# Patient Record
Sex: Female | Born: 1940 | Race: White | Hispanic: No | Smoking: Never smoker
Health system: Southern US, Community
[De-identification: ages and names within clinical notes are randomized; demographics above are authoritative.]

## PROBLEM LIST (undated history)

## (undated) DIAGNOSIS — M1990 Unspecified osteoarthritis, unspecified site: Secondary | ICD-10-CM

## (undated) DIAGNOSIS — K5790 Diverticulosis of intestine, part unspecified, without perforation or abscess without bleeding: Secondary | ICD-10-CM

## (undated) DIAGNOSIS — I10 Essential (primary) hypertension: Secondary | ICD-10-CM

## (undated) DIAGNOSIS — E785 Hyperlipidemia, unspecified: Secondary | ICD-10-CM

## (undated) DIAGNOSIS — Z87442 Personal history of urinary calculi: Secondary | ICD-10-CM

## (undated) HISTORY — DX: Diverticulosis of intestine, part unspecified, without perforation or abscess without bleeding: K57.90

## (undated) HISTORY — DX: Unspecified osteoarthritis, unspecified site: M19.90

## (undated) HISTORY — PX: TONSILLECTOMY: SUR1361

## (undated) HISTORY — PX: BREAST CYST ASPIRATION: SHX578

---

## 2010-01-04 ENCOUNTER — Ambulatory Visit: Payer: Self-pay | Admitting: Internal Medicine

## 2011-03-03 DIAGNOSIS — E782 Mixed hyperlipidemia: Secondary | ICD-10-CM | POA: Insufficient documentation

## 2011-03-03 DIAGNOSIS — Z8739 Personal history of other diseases of the musculoskeletal system and connective tissue: Secondary | ICD-10-CM | POA: Insufficient documentation

## 2011-03-03 DIAGNOSIS — I10 Essential (primary) hypertension: Secondary | ICD-10-CM | POA: Insufficient documentation

## 2011-11-20 ENCOUNTER — Ambulatory Visit: Payer: Self-pay | Admitting: Family Medicine

## 2012-09-11 ENCOUNTER — Ambulatory Visit: Payer: Self-pay | Admitting: Family Medicine

## 2012-09-11 ENCOUNTER — Emergency Department: Payer: Self-pay | Admitting: Emergency Medicine

## 2012-09-11 LAB — URINALYSIS, COMPLETE
Bacteria: NONE SEEN
Bilirubin,UR: NEGATIVE
Leukocyte Esterase: NEGATIVE
Nitrite: NEGATIVE
RBC,UR: 1028 /HPF (ref 0–5)
Squamous Epithelial: NONE SEEN

## 2012-09-11 LAB — CBC WITH DIFFERENTIAL/PLATELET
Basophil %: 0.3 %
Eosinophil %: 0.2 %
HGB: 13.1 g/dL (ref 12.0–16.0)
MCH: 31.4 pg (ref 26.0–34.0)
Neutrophil #: 2.8 10*3/uL (ref 1.4–6.5)
Neutrophil %: 63.8 %
RBC: 4.16 10*6/uL (ref 3.80–5.20)
RDW: 13.9 % (ref 11.5–14.5)
WBC: 4.4 10*3/uL (ref 3.6–11.0)

## 2012-09-11 LAB — COMPREHENSIVE METABOLIC PANEL
Albumin: 4 g/dL (ref 3.4–5.0)
Alkaline Phosphatase: 48 U/L — ABNORMAL LOW (ref 50–136)
Anion Gap: 7 (ref 7–16)
BUN: 13 mg/dL (ref 7–18)
Bilirubin,Total: 0.5 mg/dL (ref 0.2–1.0)
Calcium, Total: 9.6 mg/dL (ref 8.5–10.1)
Chloride: 103 mmol/L (ref 98–107)
EGFR (African American): >60
EGFR (Non-African Amer.): >60
SGOT(AST): 32 U/L (ref 15–37)
SGPT (ALT): 30 U/L (ref 12–78)
Sodium: 138 mmol/L (ref 136–145)
Total Protein: 8.4 g/dL — ABNORMAL HIGH (ref 6.4–8.2)

## 2012-09-11 LAB — LIPASE, BLOOD: Lipase: 170 U/L (ref 73–393)

## 2012-11-23 ENCOUNTER — Ambulatory Visit: Payer: Self-pay | Admitting: Family Medicine

## 2014-01-18 ENCOUNTER — Ambulatory Visit: Payer: Self-pay | Admitting: Family Medicine

## 2014-09-16 DIAGNOSIS — M1711 Unilateral primary osteoarthritis, right knee: Secondary | ICD-10-CM | POA: Insufficient documentation

## 2015-06-04 ENCOUNTER — Other Ambulatory Visit: Payer: Self-pay | Admitting: Family Medicine

## 2015-06-04 DIAGNOSIS — Z1231 Encounter for screening mammogram for malignant neoplasm of breast: Secondary | ICD-10-CM

## 2015-06-07 ENCOUNTER — Ambulatory Visit
Admission: RE | Admit: 2015-06-07 | Discharge: 2015-06-07 | Disposition: A | Discharge status: Home / Self Care | Payer: Medicare Other | Source: Ambulatory Visit | Attending: Family Medicine | Admitting: Family Medicine

## 2015-06-07 DIAGNOSIS — Z1231 Encounter for screening mammogram for malignant neoplasm of breast: Secondary | ICD-10-CM | POA: Insufficient documentation

## 2015-07-04 ENCOUNTER — Encounter: Payer: Self-pay | Admitting: *Deleted

## 2015-07-05 ENCOUNTER — Encounter
Admission: RE | Disposition: A | Discharge status: Home / Self Care | Payer: Self-pay | Source: Ambulatory Visit | Attending: Gastroenterology

## 2015-07-05 ENCOUNTER — Ambulatory Visit
Admission: RE | Admit: 2015-07-05 | Discharge: 2015-07-05 | Disposition: A | Discharge status: Home / Self Care | Payer: Medicare Other | Source: Ambulatory Visit | Attending: Gastroenterology | Admitting: Gastroenterology

## 2015-07-05 ENCOUNTER — Encounter: Payer: Self-pay | Admitting: Anesthesiology

## 2015-07-05 ENCOUNTER — Ambulatory Visit: Payer: Medicare Other | Admitting: Anesthesiology

## 2015-07-05 DIAGNOSIS — E785 Hyperlipidemia, unspecified: Secondary | ICD-10-CM | POA: Insufficient documentation

## 2015-07-05 DIAGNOSIS — K625 Hemorrhage of anus and rectum: Secondary | ICD-10-CM | POA: Insufficient documentation

## 2015-07-05 DIAGNOSIS — I10 Essential (primary) hypertension: Secondary | ICD-10-CM | POA: Insufficient documentation

## 2015-07-05 DIAGNOSIS — Z87891 Personal history of nicotine dependence: Secondary | ICD-10-CM | POA: Insufficient documentation

## 2015-07-05 DIAGNOSIS — K5730 Diverticulosis of large intestine without perforation or abscess without bleeding: Secondary | ICD-10-CM | POA: Insufficient documentation

## 2015-07-05 DIAGNOSIS — Z79899 Other long term (current) drug therapy: Secondary | ICD-10-CM | POA: Diagnosis not present

## 2015-07-05 DIAGNOSIS — Z7982 Long term (current) use of aspirin: Secondary | ICD-10-CM | POA: Diagnosis not present

## 2015-07-05 HISTORY — DX: Hyperlipidemia, unspecified: E78.5

## 2015-07-05 HISTORY — DX: Essential (primary) hypertension: I10

## 2015-07-05 HISTORY — PX: COLONOSCOPY WITH PROPOFOL: SHX5780

## 2015-07-05 SURGERY — COLONOSCOPY WITH PROPOFOL
Anesthesia: General

## 2015-07-05 MED ORDER — FENTANYL CITRATE (PF) 100 MCG/2ML IJ SOLN
INTRAMUSCULAR | Status: DC | PRN
  Administered 2015-07-05: 50 ug via INTRAVENOUS

## 2015-07-05 MED ORDER — PROPOFOL 500 MG/50ML IV EMUL
INTRAVENOUS | Status: DC | PRN
  Administered 2015-07-05: 120 ug/kg/min via INTRAVENOUS

## 2015-07-05 MED ORDER — SODIUM CHLORIDE 0.9 % IV SOLN
INTRAVENOUS | Status: DC
  Administered 2015-07-05: 09:00:00 via INTRAVENOUS

## 2015-07-05 MED ORDER — MIDAZOLAM HCL 2 MG/2ML IJ SOLN
INTRAMUSCULAR | Status: DC | PRN
  Administered 2015-07-05: 1 mg via INTRAVENOUS

## 2015-07-05 NOTE — Anesthesia Preprocedure Evaluation (Signed)
Anesthesia Evaluation  Patient identified by MRN, date of birth, ID band Patient awake    Reviewed: Allergy & Precautions, NPO status , Patient's Chart, lab work & pertinent test results, reviewed documented beta blocker date and time   Airway Mallampati: II       Dental no notable dental hx. (+) Teeth Intact   Pulmonary neg pulmonary ROS,    breath sounds clear to auscultation       Cardiovascular Exercise Tolerance: Good hypertension, Pt. on home beta blockers  Rhythm:Regular Rate:Normal     Neuro/Psych    GI/Hepatic negative GI ROS, Neg liver ROS,   Endo/Other  negative endocrine ROS  Renal/GU negative Renal ROS     Musculoskeletal   Abdominal Normal abdominal exam  (+)   Peds  Hematology negative hematology ROS (+)   Anesthesia Other Findings   Reproductive/Obstetrics                             Anesthesia Physical Anesthesia Plan  ASA: II  Anesthesia Plan: General   Post-op Pain Management:    Induction: Intravenous  Airway Management Planned: Nasal Cannula  Additional Equipment:   Intra-op Plan:   Post-operative Plan:   Informed Consent: I have reviewed the patients History and Physical, chart, labs and discussed the procedure including the risks, benefits and alternatives for the proposed anesthesia with the patient or authorized representative who has indicated his/her understanding and acceptance.     Plan Discussed with: CRNA  Anesthesia Plan Comments:         Anesthesia Quick Evaluation

## 2015-07-05 NOTE — Transfer of Care (Signed)
Immediate Anesthesia Transfer of Care Note  Patient: Cassandra PulsLinda N Thai  Procedure(s) Performed: Procedure(s): COLONOSCOPY WITH PROPOFOL (N/A)  Patient Location: PACU  Anesthesia Type:General  Level of Consciousness: awake and sedated  Airway & Oxygen Therapy: Patient Spontanous Breathing and Patient connected to nasal cannula oxygen  Post-op Assessment: Report given to RN and Post -op Vital signs reviewed and stable  Post vital signs: Reviewed and stable  Last Vitals:  Filed Vitals:   07/05/15 0827  BP: 155/54  Pulse: 63  Temp: 36.5 C  Resp: 15    Complications: No apparent anesthesia complications

## 2015-07-05 NOTE — Op Note (Signed)
[facility] Gastroenterology Patient Name: Cassandra Ball Procedure Date: 07/05/2015 9:15 AM MRN: [id number] Account #: [id number] Date of Birth: Apr 01, 1941 Admit Type: Outpatient Age: 74 Room: [facility] ENDO ROOM 3 Gender: Female Note Status: Finalized Procedure:         Colonoscopy Indications:       Rectal bleeding Providers:         Ezzard Standing. Bluford Kaufmann, MD Referring MD:      Clelia Croft. Ingledue (Referring MD) Medicines:         Monitored Anesthesia Care Complications:     No immediate complications. Procedure:         Pre-Anesthesia Assessment:                    - Prior to the procedure, a History and Physical was                     performed, and patient medications, allergies and                     sensitivities were reviewed. The patient's tolerance of                     previous anesthesia was reviewed.                    - The risks and benefits of the procedure and the sedation                     options and risks were discussed with the patient. All                     questions were answered and informed consent was obtained.                    - After reviewing the risks and benefits, the patient was                     deemed in satisfactory condition to undergo the procedure.                    After obtaining informed consent, the colonoscope was                     passed under direct vision. Throughout the procedure, the                     patient's blood pressure, pulse, and oxygen saturations                     were monitored continuously. The Olympus PCF-H180AL                     colonoscope ( S#: [id number] ) was introduced through the                     anus and advanced to the the cecum, identified by                     appendiceal orifice and ileocecal valve. The colonoscopy                     was performed without difficulty. The patient tolerated  the procedure well. The quality of the bowel preparation                     was  good. Findings:      Multiple small and large-mouthed diverticula were found in the sigmoid       colon.      The exam was otherwise without abnormality. Impression:        - Diverticulosis in the sigmoid colon.                    - The examination was otherwise normal.                    - No specimens collected. Recommendation:    - Discharge patient to home.                    - The findings and recommendations were discussed with the                     patient. Procedure Code(s): --- Professional ---                    [id number], Colonoscopy, flexible; diagnostic, including                     collection of specimen(s) by brushing or washing, when                     performed (separate procedure) Diagnosis Code(s): --- Professional ---                    K62.5, Hemorrhage of anus and rectum                    K57.30, Diverticulosis of large intestine without                     perforation or abscess without bleeding CPT copyright 2014 American Medical Association. All rights reserved. The codes documented in this report are preliminary and upon coder review may  be revised to meet current compliance requirements. Wallace CullensPaul Y Emmogene Simson, MD 07/05/2015 9:38:00 AM This report has been signed electronically. Number of Addenda: 0 Note Initiated On: 07/05/2015 9:15 AM Scope Withdrawal Time: 0 hours 7 minutes 11 seconds  Total Procedure Duration: 0 hours 13 minutes 27 seconds       [facility]

## 2015-07-05 NOTE — H&P (Signed)
  Date of Initial H&P: 06/14/2015  History reviewed, patient examined, no change in status, stable for surgery. 

## 2015-07-05 NOTE — Anesthesia Procedure Notes (Signed)
Performed by: COOK-MARTIN, Rashena Dowling Pre-anesthesia Checklist: Patient identified, Emergency Drugs available, Suction available, Patient being monitored and Timeout performed Patient Re-evaluated:Patient Re-evaluated prior to inductionOxygen Delivery Method: Nasal cannula Preoxygenation: Pre-oxygenation with 100% oxygen Intubation Type: IV induction Placement Confirmation: CO2 detector and positive ETCO2       

## 2015-07-05 NOTE — Anesthesia Postprocedure Evaluation (Signed)
Anesthesia Post Note  Patient: Cassandra Ball  Procedure(s) Performed: Procedure(s) (LRB): COLONOSCOPY WITH PROPOFOL (N/A)  Patient location during evaluation: PACU Anesthesia Type: General Level of consciousness: awake Pain management: pain level controlled Vital Signs Assessment: post-procedure vital signs reviewed and stable Respiratory status: spontaneous breathing Cardiovascular status: stable Anesthetic complications: no    Last Vitals:  Filed Vitals:   07/05/15 0827 07/05/15 0943  BP: 155/54 97/46  Pulse: 63 62  Temp: 36.5 C 36 C  Resp: 15 15    Last Pain: There were no vitals filed for this visit.               VAN STAVEREN,Kentrell Guettler

## 2015-07-09 ENCOUNTER — Encounter: Payer: Self-pay | Admitting: Gastroenterology

## 2016-06-19 DIAGNOSIS — E7439 Other disorders of intestinal carbohydrate absorption: Secondary | ICD-10-CM

## 2016-06-19 DIAGNOSIS — R7302 Impaired glucose tolerance (oral): Secondary | ICD-10-CM | POA: Insufficient documentation

## 2016-06-19 HISTORY — DX: Other disorders of intestinal carbohydrate absorption: E74.39

## 2016-08-01 ENCOUNTER — Other Ambulatory Visit: Payer: Self-pay | Admitting: Family Medicine

## 2016-08-01 DIAGNOSIS — Z1231 Encounter for screening mammogram for malignant neoplasm of breast: Secondary | ICD-10-CM

## 2016-08-12 ENCOUNTER — Ambulatory Visit
Admission: RE | Admit: 2016-08-12 | Discharge: 2016-08-12 | Disposition: A | Discharge status: Home / Self Care | Payer: Medicare HMO | Source: Ambulatory Visit | Attending: Family Medicine | Admitting: Family Medicine

## 2016-08-12 DIAGNOSIS — Z1231 Encounter for screening mammogram for malignant neoplasm of breast: Secondary | ICD-10-CM | POA: Insufficient documentation

## 2017-08-19 ENCOUNTER — Other Ambulatory Visit: Payer: Self-pay | Admitting: Family Medicine

## 2017-08-19 DIAGNOSIS — Z1231 Encounter for screening mammogram for malignant neoplasm of breast: Secondary | ICD-10-CM

## 2017-08-27 ENCOUNTER — Ambulatory Visit
Admission: RE | Admit: 2017-08-27 | Discharge: 2017-08-27 | Disposition: A | Discharge status: Home / Self Care | Payer: Medicare HMO | Source: Ambulatory Visit | Attending: Family Medicine | Admitting: Family Medicine

## 2017-08-27 DIAGNOSIS — R928 Other abnormal and inconclusive findings on diagnostic imaging of breast: Secondary | ICD-10-CM | POA: Insufficient documentation

## 2017-08-27 DIAGNOSIS — Z1231 Encounter for screening mammogram for malignant neoplasm of breast: Secondary | ICD-10-CM | POA: Diagnosis not present

## 2017-09-01 ENCOUNTER — Other Ambulatory Visit: Payer: Self-pay | Admitting: Family Medicine

## 2017-09-01 DIAGNOSIS — R928 Other abnormal and inconclusive findings on diagnostic imaging of breast: Secondary | ICD-10-CM

## 2017-09-01 DIAGNOSIS — N6489 Other specified disorders of breast: Secondary | ICD-10-CM

## 2017-09-03 ENCOUNTER — Ambulatory Visit
Admission: RE | Admit: 2017-09-03 | Discharge: 2017-09-03 | Disposition: A | Discharge status: Home / Self Care | Payer: Medicare HMO | Source: Ambulatory Visit | Attending: Family Medicine | Admitting: Family Medicine

## 2017-09-03 DIAGNOSIS — N6489 Other specified disorders of breast: Secondary | ICD-10-CM | POA: Insufficient documentation

## 2017-09-03 DIAGNOSIS — R928 Other abnormal and inconclusive findings on diagnostic imaging of breast: Secondary | ICD-10-CM

## 2018-03-16 ENCOUNTER — Other Ambulatory Visit: Payer: Self-pay | Admitting: Family Medicine

## 2018-03-16 DIAGNOSIS — Z780 Asymptomatic menopausal state: Secondary | ICD-10-CM

## 2018-09-20 ENCOUNTER — Other Ambulatory Visit: Payer: Self-pay | Admitting: Family Medicine

## 2018-09-20 DIAGNOSIS — Z1231 Encounter for screening mammogram for malignant neoplasm of breast: Secondary | ICD-10-CM

## 2018-09-20 DIAGNOSIS — Z780 Asymptomatic menopausal state: Secondary | ICD-10-CM

## 2018-10-05 ENCOUNTER — Other Ambulatory Visit: Payer: Self-pay

## 2018-10-05 ENCOUNTER — Ambulatory Visit
Admission: RE | Admit: 2018-10-05 | Discharge: 2018-10-05 | Disposition: A | Discharge status: Home / Self Care | Payer: Medicare HMO | Source: Ambulatory Visit | Attending: Family Medicine | Admitting: Family Medicine

## 2018-10-05 ENCOUNTER — Encounter (INDEPENDENT_AMBULATORY_CARE_PROVIDER_SITE_OTHER): Payer: Self-pay

## 2018-10-05 DIAGNOSIS — M8588 Other specified disorders of bone density and structure, other site: Secondary | ICD-10-CM | POA: Insufficient documentation

## 2018-10-05 DIAGNOSIS — Z1231 Encounter for screening mammogram for malignant neoplasm of breast: Secondary | ICD-10-CM | POA: Diagnosis not present

## 2018-10-05 DIAGNOSIS — Z780 Asymptomatic menopausal state: Secondary | ICD-10-CM | POA: Insufficient documentation

## 2019-03-25 IMAGING — MG MM DIGITAL SCREENING BILAT W/ TOMO W/ CAD
8 of 13 series · 8 of 29 positions shown · non-contrast
Comparison: Previous exam(s).

CLINICAL DATA: Screening.

EXAM:
2D DIGITAL SCREENING BILATERAL MAMMOGRAM WITH 3D TOMO WITH CAD

[L MLO]
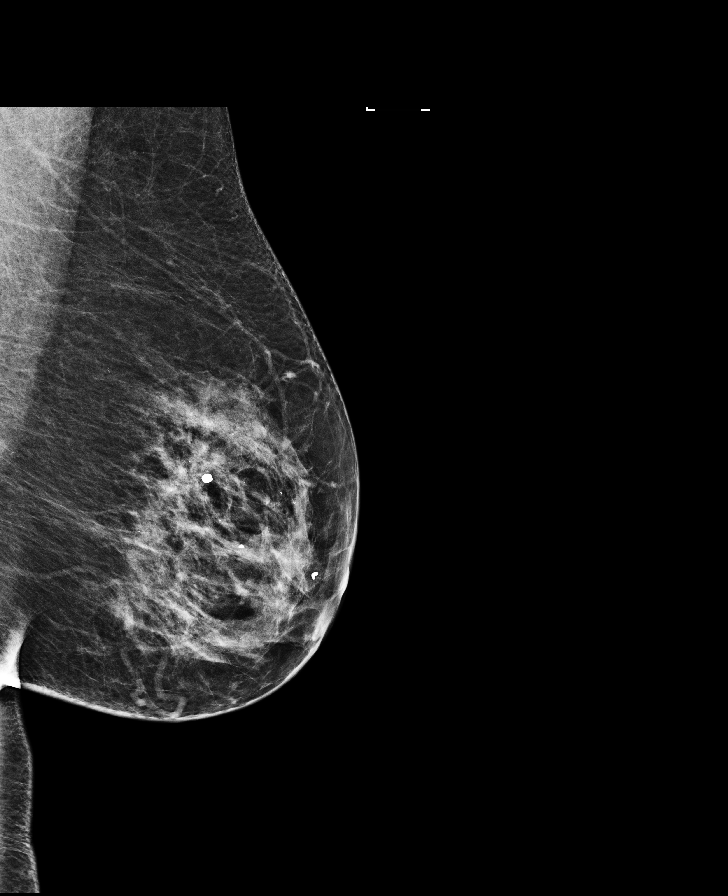

[R CC synth-2D]
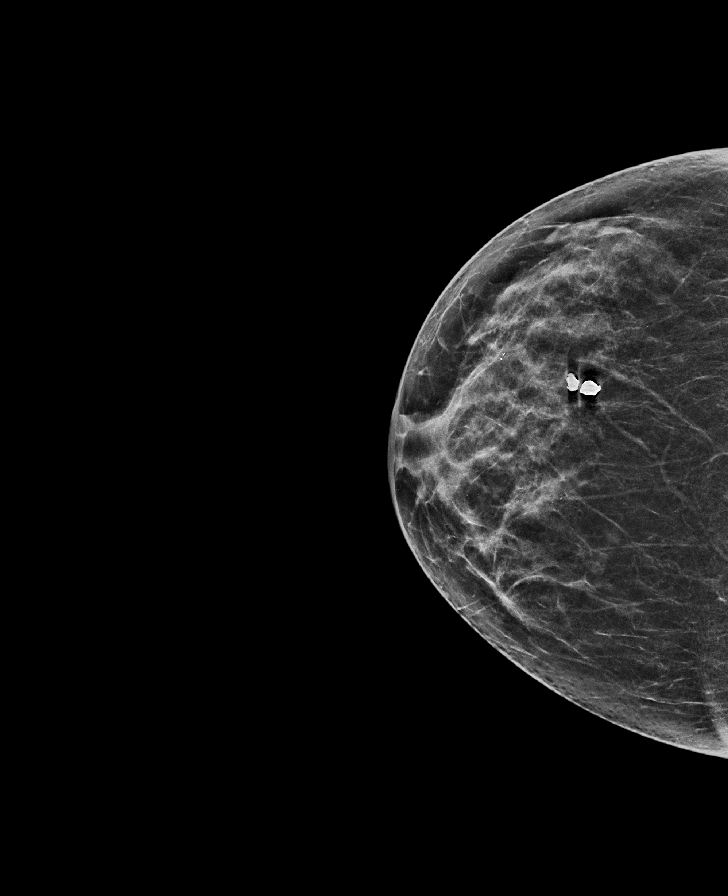

[L MLO synth-2D]
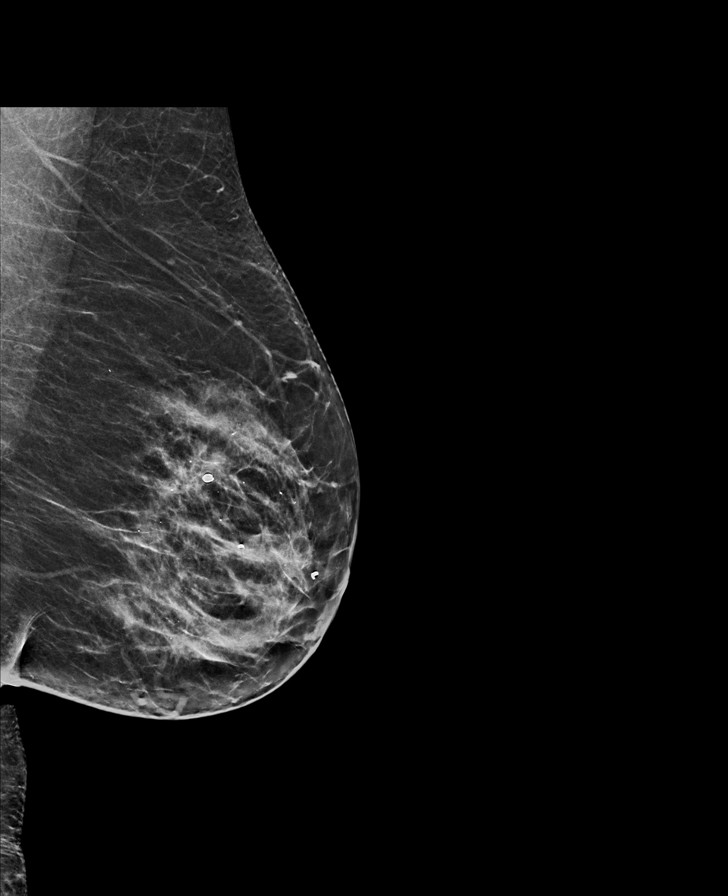

[R MLO]
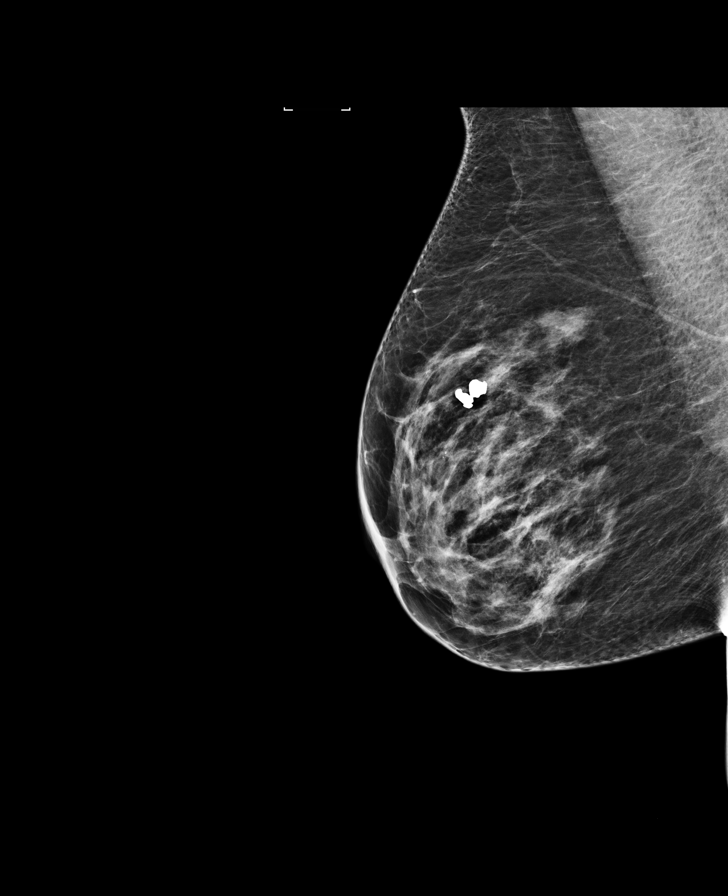

[R CC]
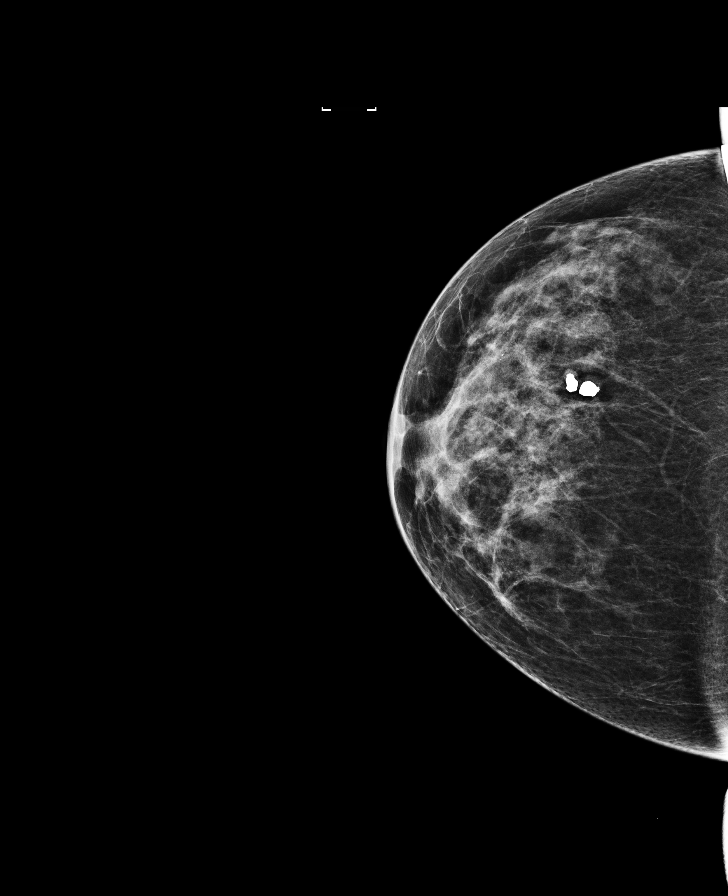

[L CC]
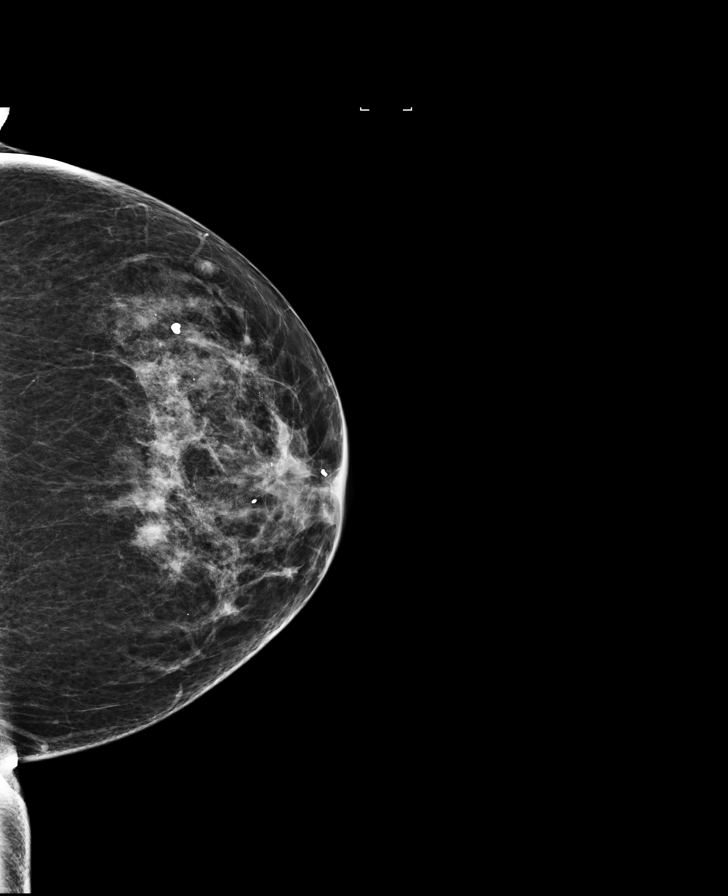

[L CC synth-2D]
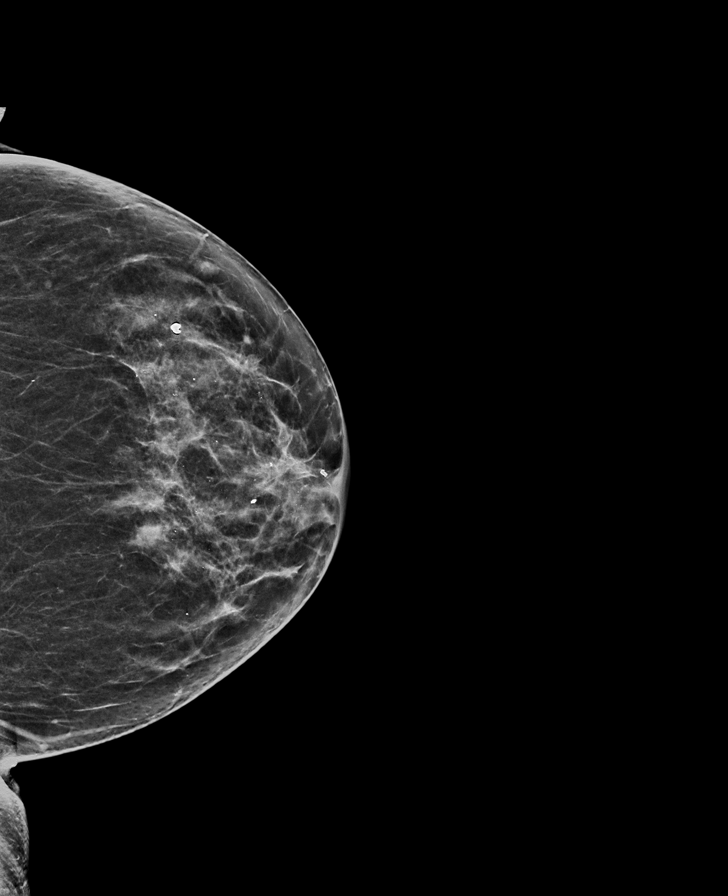

[R MLO synth-2D]
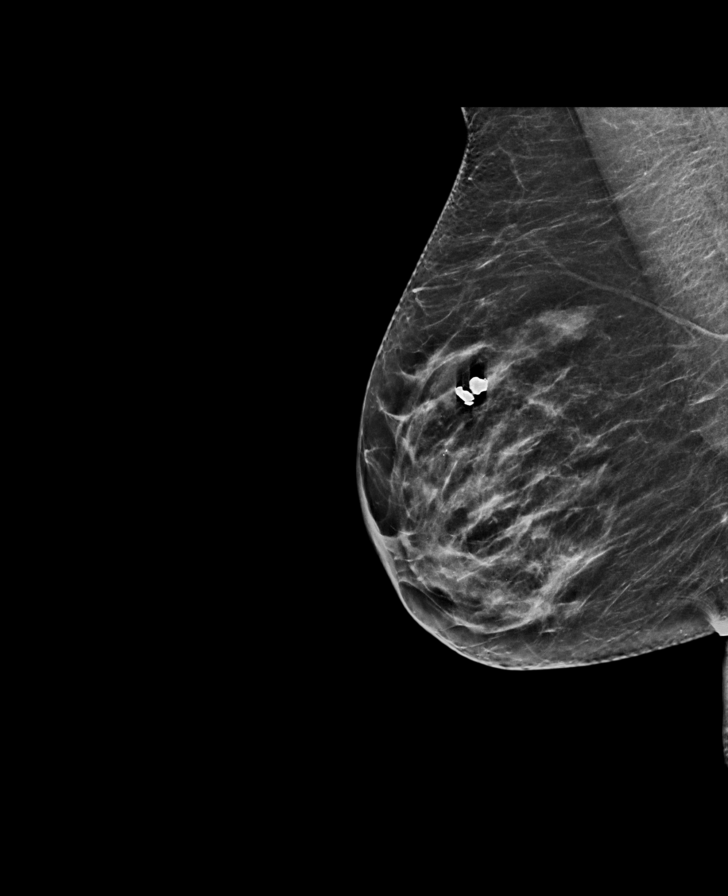

[8 of 29 positions shown; findings below may reference images not displayed]

ACR Breast Density Category c: The breast tissue is heterogeneously
dense, which may obscure small masses.
FINDINGS: In the left breast, a possible asymmetry warrants further
evaluation. In the right breast, no findings suspicious for
malignancy. Images were processed with CAD.
IMPRESSION: Further evaluation is suggested for possible asymmetry in the left
breast.

RECOMMENDATION:
Diagnostic mammogram and possibly ultrasound of the left breast.
(Code:J1-7-GG4)

The patient will be contacted regarding the findings, and additional
imaging will be scheduled.

BI-RADS CATEGORY  0: Incomplete. Need additional imaging evaluation
and/or prior mammograms for comparison.

## 2019-11-07 ENCOUNTER — Other Ambulatory Visit: Payer: Self-pay | Admitting: Physician Assistant

## 2019-11-07 DIAGNOSIS — Z1231 Encounter for screening mammogram for malignant neoplasm of breast: Secondary | ICD-10-CM

## 2019-11-21 ENCOUNTER — Other Ambulatory Visit: Payer: Self-pay

## 2019-11-21 ENCOUNTER — Ambulatory Visit
Admission: RE | Admit: 2019-11-21 | Discharge: 2019-11-21 | Disposition: A | Discharge status: Home / Self Care | Payer: Medicare HMO | Source: Ambulatory Visit | Attending: Physician Assistant | Admitting: Physician Assistant

## 2019-11-21 ENCOUNTER — Encounter (INDEPENDENT_AMBULATORY_CARE_PROVIDER_SITE_OTHER): Payer: Self-pay

## 2019-11-21 DIAGNOSIS — Z1231 Encounter for screening mammogram for malignant neoplasm of breast: Secondary | ICD-10-CM

## 2021-01-23 ENCOUNTER — Other Ambulatory Visit: Payer: Self-pay | Admitting: Physician Assistant

## 2021-01-23 DIAGNOSIS — Z1231 Encounter for screening mammogram for malignant neoplasm of breast: Secondary | ICD-10-CM

## 2021-01-29 ENCOUNTER — Ambulatory Visit
Admission: RE | Admit: 2021-01-29 | Discharge: 2021-01-29 | Disposition: A | Discharge status: Home / Self Care | Payer: Medicare HMO | Source: Ambulatory Visit | Attending: Physician Assistant | Admitting: Physician Assistant

## 2021-01-29 ENCOUNTER — Other Ambulatory Visit: Payer: Self-pay

## 2021-01-29 DIAGNOSIS — Z1231 Encounter for screening mammogram for malignant neoplasm of breast: Secondary | ICD-10-CM | POA: Insufficient documentation

## 2021-06-17 ENCOUNTER — Other Ambulatory Visit: Payer: Self-pay

## 2021-06-17 ENCOUNTER — Ambulatory Visit
Admission: RE | Admit: 2021-06-17 | Discharge: 2021-06-17 | Disposition: A | Discharge status: Home / Self Care | Payer: Medicare HMO | Source: Ambulatory Visit | Attending: Family Medicine | Admitting: Family Medicine

## 2021-06-17 ENCOUNTER — Ambulatory Visit
Admission: RE | Admit: 2021-06-17 | Discharge: 2021-06-17 | Disposition: A | Discharge status: Home / Self Care | Payer: Medicare HMO | Attending: Family Medicine | Admitting: Family Medicine

## 2021-06-17 ENCOUNTER — Other Ambulatory Visit: Payer: Self-pay | Admitting: Family Medicine

## 2021-06-17 DIAGNOSIS — G8929 Other chronic pain: Secondary | ICD-10-CM

## 2021-06-17 DIAGNOSIS — M25561 Pain in right knee: Secondary | ICD-10-CM | POA: Diagnosis present

## 2021-06-17 DIAGNOSIS — M25562 Pain in left knee: Secondary | ICD-10-CM | POA: Insufficient documentation

## 2021-12-01 DIAGNOSIS — M1712 Unilateral primary osteoarthritis, left knee: Secondary | ICD-10-CM | POA: Insufficient documentation

## 2021-12-10 ENCOUNTER — Inpatient Hospital Stay: Payer: Medicare HMO

## 2021-12-10 ENCOUNTER — Inpatient Hospital Stay: Payer: Medicare HMO | Attending: Oncology | Admitting: Oncology

## 2021-12-10 ENCOUNTER — Encounter: Payer: Self-pay | Admitting: Oncology

## 2021-12-10 VITALS — BP 135/60 | HR 70 | Temp 98.1°F | Resp 16 | Wt 160.7 lb

## 2021-12-10 DIAGNOSIS — Z86718 Personal history of other venous thrombosis and embolism: Secondary | ICD-10-CM | POA: Insufficient documentation

## 2021-12-10 DIAGNOSIS — Z8249 Family history of ischemic heart disease and other diseases of the circulatory system: Secondary | ICD-10-CM

## 2021-12-10 DIAGNOSIS — I10 Essential (primary) hypertension: Secondary | ICD-10-CM | POA: Insufficient documentation

## 2021-12-10 DIAGNOSIS — Z7982 Long term (current) use of aspirin: Secondary | ICD-10-CM | POA: Insufficient documentation

## 2021-12-10 DIAGNOSIS — E785 Hyperlipidemia, unspecified: Secondary | ICD-10-CM | POA: Diagnosis not present

## 2021-12-10 NOTE — Progress Notes (Signed)
? ?Hematology/Oncology Consult note ?[facility] ?[phone] [phone] ? ?Patient Care Team: ?Jose-Mathews, Saul FordyceJessnie, MD as PCP - General (Family Medicine)  ? ?Name of the patient: Cassandra SaaLinda Ball  ?[id number]  ?11/09/1940  ? ? ?Reason for referral-anticoagulation options for upcoming total knee arthroplasty ?  ?Referring physician-Dr. Francesco SorJames Hooten ? ?Date of visit: 12/10/21 ? ? ?History of presenting illness-patient is a 81 year old female with no prior history of DVT or PE.  She is not on any anticoagulation at present.  Her mother has a history of retinal vein thrombosis.  Her granddaughter had an episode of walking pneumonia and collapsed and died.  Autopsy showed pulmonary embolism.  Patient has had 3 children in the past and did not have any postpartum DVT or PE.  She is a non-smoker and otherwise active for her age. ? ?ECOG PS- 1 ? ?Pain scale- 0 ? ? ?Review of systems- Review of Systems  ?Constitutional:  Negative for chills, fever, malaise/fatigue and weight loss.  ?HENT:  Negative for congestion, ear discharge and nosebleeds.   ?Eyes:  Negative for blurred vision.  ?Respiratory:  Negative for cough, hemoptysis, sputum production, shortness of breath and wheezing.   ?Cardiovascular:  Negative for chest pain, palpitations, orthopnea and claudication.  ?Gastrointestinal:  Negative for abdominal pain, blood in stool, constipation, diarrhea, heartburn, melena, nausea and vomiting.  ?Genitourinary:  Negative for dysuria, flank pain, frequency, hematuria and urgency.  ?Musculoskeletal:  Negative for back pain, joint pain and myalgias.  ?Skin:  Negative for rash.  ?Neurological:  Negative for dizziness, tingling, focal weakness, seizures, weakness and headaches.  ?Endo/Heme/Allergies:  Does not bruise/bleed easily.  ?Psychiatric/Behavioral:  Negative for depression and suicidal ideas. The patient does not have insomnia.   ? ?No Known Allergies ? ?There are no problems to  display for this patient. ? ? ? ?Past Medical History:  ?Diagnosis Date  ? Hyperlipidemia   ? Hypertension   ? ? ? ?Past Surgical History:  ?Procedure Laterality Date  ? BREAST CYST ASPIRATION Left   ? neg  ? COLONOSCOPY WITH PROPOFOL N/A 07/05/2015  ? Procedure: COLONOSCOPY WITH PROPOFOL;  Surgeon: Wallace CullensPaul Y Oh, MD;  [facility];  Service: Gastroenterology;  Laterality: N/A;  ? TONSILLECTOMY    ? ? ?Social History  ? ?Socioeconomic History  ? Marital status: Widowed  ?  Spouse name: Not on file  ? Number of children: Not on file  ? Years of education: Not on file  ? Highest education level: Not on file  ?Occupational History  ? Not on file  ?Tobacco Use  ? Smoking status: Not on file  ? Smokeless tobacco: Not on file  ?Substance and Sexual Activity  ? Alcohol use: Not on file  ? Drug use: Not on file  ? Sexual activity: Not on file  ?Other Topics Concern  ? Not on file  ?Social History Narrative  ? Not on file  ? ?Social Determinants of Health  ? ?Financial Resource Strain: Not on file  ?Food Insecurity: Not on file  ?Transportation Needs: Not on file  ?Physical Activity: Not on file  ?Stress: Not on file  ?Social Connections: Not on file  ?Intimate Partner Violence: Not on file  ? ?  ?Family History  ?Problem Relation Age of Onset  ? Breast cancer Neg Hx   ? ? ? ?Current Outpatient Medications:  ?  aspirin 325 MG tablet, Take 325 mg by mouth daily., Disp: , Rfl:  ?  Calcium Carbonate-Vitamin D 300-100 MG-UNIT  CAPS, Take by mouth., Disp: , Rfl:  ?  losartan-hydrochlorothiazide (HYZAAR) 100-25 MG tablet, Take 1 tablet by mouth daily., Disp: , Rfl:  ?  metoprolol succinate (TOPROL-XL) 100 MG 24 hr tablet, Take 100 mg by mouth daily. Take with or immediately following a meal., Disp: , Rfl:  ?  multivitamin-iron-minerals-folic acid (THERAPEUTIC-M) TABS tablet, Take 1 tablet by mouth daily., Disp: , Rfl:  ?  nystatin cream (MYCOSTATIN), Apply 1 application topically 2 (two) times daily., Disp: , Rfl:  ?   OMEGA-3 FATTY ACIDS PO, Take by mouth., Disp: , Rfl:  ?  polyethylene glycol-electrolytes (NULYTELY/GOLYTELY) 420 G solution, Take 4,000 mLs by mouth once., Disp: , Rfl:  ?  rosuvastatin (CRESTOR) 20 MG tablet, Take 20 mg by mouth daily., Disp: , Rfl:  ?  vitamin C (ASCORBIC ACID) 500 MG tablet, Take 500 mg by mouth daily., Disp: , Rfl:  ? ? ?Physical exam:  ?Vitals:  ? 12/10/21 1124  ?BP: 135/60  ?Pulse: 70  ?Resp: 16  ?Temp: 98.1 ?F (36.7 ?C)  ?TempSrc: Oral  ?Weight: 160 lb 11.2 oz (72.9 kg)  ? ?Physical Exam ?Constitutional:   ?   General: She is not in acute distress. ?Cardiovascular:  ?   Rate and Rhythm: Normal rate and regular rhythm.  ?   Heart sounds: Normal heart sounds.  ?Pulmonary:  ?   Effort: Pulmonary effort is normal.  ?   Breath sounds: Normal breath sounds.  ?Abdominal:  ?   General: Bowel sounds are normal.  ?   Palpations: Abdomen is soft.  ?Skin: ?   General: Skin is warm and dry.  ?Neurological:  ?   Mental Status: She is alert and oriented to person, place, and time.  ?  ? ? ? ? ?  Latest Ref Rng & Units 09/11/2012  ?  3:13 PM  ?CMP  ?Glucose 65 - 99 mg/dL 027    ?BUN 7 - 18 mg/dL 13    ?Creatinine 0.60 - 1.30 mg/dL 2.53    ?Sodium 136 - 145 mmol/L 138    ?Potassium 3.5 - 5.1 mmol/L 3.3    ?Chloride 98 - 107 mmol/L 103    ?CO2 21 - 32 mmol/L 28    ?Calcium 8.5 - 10.1 mg/dL 9.6    ?Total Protein 6.4 - 8.2 g/dL 8.4    ?Total Bilirubin 0.2 - 1.0 mg/dL 0.5    ?Alkaline Phos 50 - 136 Unit/L 48    ?AST 15 - 37 Unit/L 32    ?ALT 12 - 78 U/L 30    ? ? ?  Latest Ref Rng & Units 09/11/2012  ?  3:13 PM  ?CBC  ?WBC 3.6 - 11.0 x10 3/mm 3 4.4    ?Hemoglobin 12.0 - 16.0 g/dL 66.4    ?Hematocrit 35.0 - 47.0 % 38.4    ?Platelets 150 - 440 x10 3/mm 3 149    ? ? ?No images are attached to the encounter. ? ?No results found. ? ?Assessment and plan- Patient is a 81 y.o. female referred for anticoagulation recommendations in anticipation of total knee arthroplasty ? ?Patient's mother has a history of retinal vein  thrombosis and her granddaughter presumably died of pulmonary embolism.  Both of them were never tested/could be tested for any inherited hypercoagulable conditions.  Patient has gone through all her life without any history of DVT or PE.  She has not had any postpartum complications in the past.  Therefore hypercoagulable work-up is not indicated for her. ? ?  With regards to anticoagulation for total knee arthroplasty-she will follow standard post procedure thromboprophylaxis guidelines with either low molecular weight heparin or newer anticoagulants for the first 5 to 10 days.  Typically it would have been okay to switch her to aspirin for the remainder of the days to complete 14 days of prophylactic anticoagulation but given her family history-it would be okay to keep her on either low molecular weight heparin or newer anticoagulation prophylactic dose for 2 weeks instead of switching her to aspirin.  Encourage ambulation early postsurgery. ? ?Patient does not require any blood work from me or follow-up with me at this time ? ? ?Thank you for this kind referral and the opportunity to participate in the care of this  ?Patient ? ? ?Visit Diagnosis ?No diagnosis found. ? ?Dr. Owens Shark, MD, MPH ?[facility] ?[phone] ?12/10/2021 ? ? ? ? ? ? ? ?    ? ? ? ? ?

## 2021-12-20 NOTE — Discharge Instructions (Signed)
Instructions after Total Knee Replacement   Cassandra Ball, Jr., M.D.     Dept. of Orthopaedics & Sports Medicine  [facility]  [address]  [place], [place]  [address]  Phone: [phone]   Fax: [phone]    DIET: Drink plenty of non-alcoholic fluids. Resume your normal diet. Include foods high in fiber.  ACTIVITY:  You may use crutches or a walker with weight-bearing as tolerated, unless instructed otherwise. You may be weaned off of the walker or crutches by your Physical Therapist.  Do NOT place pillows under the knee. Anything placed under the knee could limit your ability to straighten the knee.   Continue doing gentle exercises. Exercising will reduce the pain and swelling, increase motion, and prevent muscle weakness.   Please continue to use the TED compression stockings for 6 weeks. You may remove the stockings at night, but should reapply them in the morning. Do not drive or operate any equipment until instructed.  WOUND CARE:  Continue to use the PolarCare or ice packs periodically to reduce pain and swelling. You may bathe or shower after the staples are removed at the first office visit following surgery.  MEDICATIONS: You may resume your regular medications. Please take the pain medication as prescribed on the medication. Do not take pain medication on an empty stomach. You have been given a prescription for a blood thinner (Lovenox or Coumadin). Please take the medication as instructed. (NOTE: After completing a 2 week course of Lovenox, take one Enteric-coated aspirin once a day. This along with elevation will help reduce the possibility of phlebitis in your operated leg.) Do not drive or drink alcoholic beverages when taking pain medications.  CALL THE OFFICE FOR: Temperature above 101 degrees Excessive bleeding or drainage on the dressing. Excessive swelling, coldness, or paleness of the toes. Persistent nausea and vomiting.  FOLLOW-UP:  You  should have an appointment to return to the office in 10-14 days after surgery. Arrangements have been made for continuation of Physical Therapy (either home therapy or outpatient therapy).   [facility] Department Directory         [email or web address]       [email or web address]          Cardiology  Appointments: [place] - [phone] [place] - [phone]  Endocrinology  Appointments: [place] - [phone] [place] - [phone]  Gastroenterology  Appointments: [place] - [phone] [place] - [phone]        General Surgery   Appointments: [place] - [phone]  Internal Medicine/Family Medicine  Appointments: [place] - [phone] [place] - [phone] [place] - [phone]  Metabolic and Weigh Loss Surgery  Appointments: [place] - [phone]        Neurology  Appointments: [place] - [phone] [place] - [phone]  Neurosurgery  Appointments: [place] - [phone]  Obstetrics & Gynecology  Appointments: [place] - [phone] [place] - [phone]        Pediatrics  Appointments: [place] - [phone] [place] - [phone]  Physiatry  Appointments: [place] -[phone]  Physical Therapy  Appointments: [place] - [phone] [place] - [phone]        Podiatry  Appointments: [place] - [phone] [place] - [phone]  Pulmonology  Appointments: [place] - [phone]  Rheumatology  Appointments: [place] - [phone]         Location: [facility]  [address] , [place]  [address]  [place] Location: [facility] [address] [place], [place]  [address]  [place] Location: [facility] [address] [place],   [address]    

## 2022-01-03 ENCOUNTER — Encounter
Admission: RE | Admit: 2022-01-03 | Discharge: 2022-01-03 | Disposition: A | Discharge status: Home / Self Care | Payer: Medicare HMO | Source: Ambulatory Visit | Attending: Orthopedic Surgery | Admitting: Orthopedic Surgery

## 2022-01-03 VITALS — BP 153/64 | HR 74 | Temp 97.8°F | Resp 18 | Ht 63.0 in | Wt 157.2 lb

## 2022-01-03 DIAGNOSIS — M1711 Unilateral primary osteoarthritis, right knee: Secondary | ICD-10-CM | POA: Insufficient documentation

## 2022-01-03 DIAGNOSIS — Z01818 Encounter for other preprocedural examination: Secondary | ICD-10-CM | POA: Diagnosis present

## 2022-01-03 DIAGNOSIS — Z01812 Encounter for preprocedural laboratory examination: Secondary | ICD-10-CM

## 2022-01-03 DIAGNOSIS — R350 Frequency of micturition: Secondary | ICD-10-CM | POA: Diagnosis not present

## 2022-01-03 DIAGNOSIS — R7302 Impaired glucose tolerance (oral): Secondary | ICD-10-CM | POA: Insufficient documentation

## 2022-01-03 LAB — SURGICAL PCR SCREEN
MRSA, PCR: NEGATIVE
Staphylococcus aureus: NEGATIVE

## 2022-01-03 LAB — URINALYSIS, ROUTINE W REFLEX MICROSCOPIC
Bilirubin Urine: NEGATIVE
Glucose, UA: NEGATIVE mg/dL
Hgb urine dipstick: NEGATIVE
Ketones, ur: NEGATIVE mg/dL
Nitrite: NEGATIVE
Protein, ur: NEGATIVE mg/dL
Specific Gravity, Urine: 1.01 (ref 1.005–1.030)
pH: 7 (ref 5.0–8.0)

## 2022-01-03 LAB — COMPREHENSIVE METABOLIC PANEL
ALT: 31 U/L (ref 0–44)
AST: 34 U/L (ref 15–41)
Albumin: 3.8 g/dL (ref 3.5–5.0)
Alkaline Phosphatase: 40 U/L (ref 38–126)
Anion gap: 7 (ref 5–15)
BUN: 16 mg/dL (ref 8–23)
CO2: 30 mmol/L (ref 22–32)
Calcium: 9.6 mg/dL (ref 8.9–10.3)
Chloride: 99 mmol/L (ref 98–111)
Creatinine, Ser: 0.47 mg/dL (ref 0.44–1.00)
GFR, Estimated: >60 mL/min (ref >60)
Glucose, Bld: 100 mg/dL — ABNORMAL HIGH (ref 70–99)
Potassium: 3.3 mmol/L — ABNORMAL LOW (ref 3.5–5.1)
Sodium: 136 mmol/L (ref 135–145)
Total Bilirubin: 0.7 mg/dL (ref 0.3–1.2)
Total Protein: 8.3 g/dL — ABNORMAL HIGH (ref 6.5–8.1)

## 2022-01-03 LAB — CBC
HCT: 35.3 % — ABNORMAL LOW (ref 36.0–46.0)
Hemoglobin: 11.6 g/dL — ABNORMAL LOW (ref 12.0–15.0)
MCH: 31 pg (ref 26.0–34.0)
MCHC: 32.9 g/dL (ref 30.0–36.0)
MCV: 94.4 fL (ref 80.0–100.0)
Platelets: 251 10*3/uL (ref 150–400)
RBC: 3.74 MIL/uL — ABNORMAL LOW (ref 3.87–5.11)
RDW: 13.6 % (ref 11.5–15.5)
WBC: 5.6 10*3/uL (ref 4.0–10.5)
nRBC: 0 % (ref 0.0–0.2)

## 2022-01-03 LAB — HEMOGLOBIN A1C
Hgb A1c MFr Bld: 5.7 % — ABNORMAL HIGH (ref 4.8–5.6)
Mean Plasma Glucose: 116.89 mg/dL

## 2022-01-03 LAB — TYPE AND SCREEN
ABO/RH(D): A POS
Antibody Screen: NEGATIVE

## 2022-01-03 LAB — SEDIMENTATION RATE: Sed Rate: 53 mm/hr — ABNORMAL HIGH (ref 0–30)

## 2022-01-03 LAB — C-REACTIVE PROTEIN: CRP: 0.7 mg/dL (ref <1.0)

## 2022-01-03 NOTE — Patient Instructions (Addendum)
Your procedure is scheduled on: Wednesday January 15, 2022. Report to Day Surgery inside [facility] 2nd floor, stop by admissions desk before getting on elevator. To find out your arrival time please call [phone] between 1PM - 3PM on Tuesday January 14, 2022.  Remember: Instructions that are not followed completely may result in serious medical risk,  up to and including death, or upon the discretion of your surgeon and anesthesiologist your  surgery may need to be rescheduled.     _X__ 1. Do not eat food after midnight the night before your procedure.                 No chewing gum or hard candies. You may drink clear liquids up to 2 hours                 before you are scheduled to arrive for your surgery- DO not drink clear                 liquids within 2 hours of the start of your surgery.                 Clear Liquids include:  water, apple juice without pulp, clear Gatorade, G2 or                  Gatorade Zero (avoid Red/Purple/Blue), Black Coffee or Tea (Do not add                 anything to coffee or tea).  __X__2.   Complete the "Ensure Clear Pre-surgery Clear Carbohydrate Drink" provided to you, 2 hours before arrival. **If you are diabetic you will be provided with an alternative drink, Gatorade Zero or G2.  __X__3.  On the morning of surgery brush your teeth with toothpaste and water, you                may rinse your mouth with mouthwash if you wish.  Do not swallow any toothpaste of mouthwash.     _X__ 4.  No Alcohol for 24 hours before or after surgery.   _X__ 5.  Do Not Smoke or use e-cigarettes For 24 Hours Prior to Your Surgery.                 Do not use any chewable tobacco products for at least 6 hours prior to                 Surgery.  _X__  6.  Do not use any recreational drugs (marijuana, cocaine, heroin, ecstasy, MDMA or other)                For at least one week prior to your surgery.  Combination of these drugs with anesthesia                 May have life threatening results.  ____  7.  Bring all medications with you on the day of surgery if instructed.   __X__8.  Notify your doctor if there is any change in your medical condition      (cold, fever, infections).     Do not wear jewelry, make-up, hairpins, clips or nail polish. Do not wear lotions, powders, or perfumes. You may wear deodorant. Do not shave 48 hours prior to surgery. Men may shave face and neck. Do not bring valuables to the hospital.    [name] is not responsible for any belongings or valuables.  Contacts, dentures  or bridgework may not be worn into surgery. Leave your suitcase in the car. After surgery it may be brought to your room. For patients admitted to the hospital, discharge time is determined by your treatment team.   Patients discharged the day of surgery will not be allowed to drive home.   Make arrangements for someone to be with you for the first 24 hours of your Same Day Discharge.   _X__ Take these medicines the morning of surgery with A SIP OF WATER:    1. carvedilol (COREG) 12.5 MG tablet  2.   3.   4.  5.  6.  ____ Fleet Enema (as directed)   __X__ Use CHG Soap (or wipes) as directed  ____ Use Benzoyl Peroxide Gel as instructed  ____ Use inhalers on the day of surgery  ____ Stop metformin 2 days prior to surgery    ____ Take 1/2 of usual insulin dose the night before surgery. No insulin the morning          of surgery.   __X__ one week prior to surgery stop aspirin 81 mg as instructed by your doctor.    __X__ One Week prior to surgery- Stop Anti-inflammatories such as Ibuprofen, Aleve, Advil, Motrin, meloxicam (MOBIC), diclofenac, etodolac, ketorolac, Toradol, Daypro, piroxicam, Goody's or BC powders. OK TO USE TYLENOL IF NEEDED   __X__ One week before stop supplements until after surgery. vitamin C (ASCORBIC ACID), OMEGA-3 FATTY ACIDS and multi vitamins   ____ Bring C-Pap to the hospital.    If you  have any questions regarding your pre-procedure instructions,  Please call Pre-admit Testing at [phone]

## 2022-01-05 LAB — URINE CULTURE: Culture: <10000 — AB

## 2022-01-12 NOTE — H&P (Signed)
ORTHOPAEDIC HISTORY & PHYSICAL Cassandra Ball, Cassandra Ball, [place] - 01/02/2022 10:45 AM EDT Formatting of this note is different from the original. [facility] Chief Complaint:   Chief Complaint  Patient presents with   Knee Pain  H & P RIGHT KNEE   History of Present Illness:   Cassandra Ball is a 81 y.o. female that presents to clinic today for her preoperative history and evaluation. Patient presents unaccompanied. The patient is scheduled to undergo a right total knee arthroplasty on 01/15/22 by Dr. Ernest Ball. Her pain began several years ago. The pain is located along the medial and anterior aspect of the knee. She describes her pain as worse with weightbearing. She reports associated swelling, with some giving way of the knee. She denies associated numbness or tingling, denies locking.   The patient's symptoms have progressed to the point that they decrease her quality of life. The patient has previously undergone conservative treatment including NSAIDS and injections to the knee without adequate control of her symptoms.  Patient has been cleared by Dr. Smith Ball (oncology) for knee replacement surgery. Denies history of lumbar surgery, no history of DVT, denies significant cardiac history.  [facility] She would like to go to their skilled nursing facility after surgery. She states she has no one that lives with her that can help after surgery.   Past Medical, Surgical, Family, Social History, Allergies, Medications:   Past Medical History:  Past Medical History:  Diagnosis Date   Arthritis   Diverticulosis 07/05/2015   Glucose intolerance (impaired glucose tolerance) 06/19/2016   Hyperlipidemia   Hypertension   Past Surgical History:  Past Surgical History:  Procedure Laterality Date   COLONOSCOPY 07/05/2015  Diverticulosis/Otherwise normal colon/No Repeat/PYO   TONSILLECTOMY   Current Medications:   Current Outpatient Medications  Medication Sig Dispense Refill   acetaminophen (TYLENOL) 650 MG ER tablet Take 650 mg by mouth 2 (two) times daily as needed for Pain   aspirin 81 MG EC tablet Take 1 tablet (81 mg total) by mouth once daily 30 tablet 11   calcium carbonate/vitamin D3 (CALTRATE 600 + D ORAL) Take 1 tablet by mouth once daily   carvediloL (COREG) 12.5 MG tablet Take 1 tablet (12.5 mg total) by mouth 2 (two) times daily with meals 180 tablet 3   cholecalciferol (VITAMIN D3) 2,000 unit tablet Take 1 tablet (2,000 Units total) by mouth once daily 30 tablet 11   diclofenac (VOLTAREN) 1 % topical gel Apply 4 g topically 4 (four) times daily 350 g 3   multivitamin (THERAGRAN) tablet 1 tab by mouth daily   omega-3 fatty acids-fish oil 300-1,000 mg capsule Take 1 capsule by mouth once daily   rosuvastatin (CRESTOR) 20 MG tablet Take 1 tablet (20 mg total) by mouth at bedtime 90 tablet 3   telmisartan-hydroCHLOROthiazide (MICARDIS HCT) 80-25 mg tablet Take 1 tablet by mouth once daily 90 tablet 3   No current facility-administered medications for this visit.   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  Tobacco Use   Smoking status: Former  Packs/day: 0.75  Years: 10.00  Pack years: 7.50  Types: Cigarettes  Quit date: 1973  Years since  quitting: 50.4   Smokeless tobacco: Never  Vaping Use   Vaping Use: Never used  Substance and Sexual Activity   Alcohol use: Yes  Alcohol/week: 5.0 standard drinks  Types: 5 Glasses of wine per week   Drug use: No  Comment: cigarettes --quit 1970   Sexual activity: Defer  Partners: Male  Birth control/protection: Post-menopausal  Other Topics Concern   Would you please tell us about the  people who live in your home, your pets, or anything else important to your social life? Yes  Comment: Dog  Social History Narrative  Widowed in September 2012. G3 P3. Retired Runner, broadcasting/film/video. Enjoys golfing. Lives in independent living. Daughter here with 3 children in [place], son in [place], son in [place].   Family History:  Family History  Problem Relation Age of Onset   Deep vein thrombosis (DVT or abnormal blood clot formation) Mother   High blood pressure (Hypertension) Mother   Stroke Mother   Depression Father   Glaucoma Father   Anxiety Son   Alzheimer's disease Maternal Aunt   Alzheimer's disease Maternal Aunt   Anxiety Son   Review of Systems:   A 10+ ROS was performed, reviewed, and the pertinent orthopaedic findings are documented in the HPI.   Physical Examination:   BP (!) 150/86 (BP Location: Left upper arm, Patient Position: Sitting, BP Cuff Size: Adult)  Ht 160 cm (5\' 3" )  Wt 71.2 kg (157 lb)  BMI 27.81 kg/m   Patient is a well-developed, well-nourished female in no acute distress. Patient has normal mood and affect. Patient is alert and oriented to person, place, and time.   HEENT: Atraumatic, normocephalic. Pupils equal and reactive to light. Extraocular motion intact. Noninjected sclera.  Cardiovascular: Regular rate and rhythm, with no murmurs, rubs, or gallops. Distal pulses palpable.  Respiratory: Lungs clear to auscultation bilaterally.     Right Knee: Soft tissue swelling: moderate Effusion: minimal Erythema: none Crepitance: mild Tenderness: medial Alignment: relative varus Mediolateral laxity: medial pseudolaxity Posterior sag: negative Patellar tracking: Good tracking without evidence of subluxation or tilt Atrophy: No significant atrophy.  Quadriceps tone was fair to good. Range of motion: 0/2/102 degrees  Able to plantarflex and dorsiflex the right ankle. Able to flex and extend the toes.  Sensation intact over the saphenous, lateral sural  cutaneous, superficial fibular, and deep fibular nerve distributions.  Tests Performed/Reviewed:  X-rays  Previous views of the right knee were reviewed. Images reveal complete loss of medial compartment joint space with bone-on-bone contact, subchondral sclerosis, and slight erosion of the tibial plateau noted medially. No fractures or dislocations.  Impression:   ICD-10-CM  1. Primary osteoarthritis of right knee M17.11   Plan:   The patient has end-stage degenerative changes of the right knee. It was explained to the patient that the condition is progressive in nature. Having failed conservative treatment, the patient has elected to proceed with a total joint arthroplasty. The patient will undergo a total joint arthroplasty with Dr. 08[birth year]14-1973. The risks of surgery, including blood clot and infection, were discussed with the patient. Measures to reduce these risks, including the use of anticoagulation, perioperative antibiotics, and early ambulation were discussed. The importance of postoperative physical therapy was discussed with the patient. The patient elects to proceed with surgery. The patient is instructed to stop all blood thinners prior to surgery. The patient is instructed to call the hospital the day before surgery to learn of the proper arrival time.   Contact our office with any questions  or concerns. Follow up as indicated, or sooner should any new problems arise, if conditions worsen, or if they are otherwise concerned.   Cassandra Gardener, PA -C [facility] [address] [place], [place] [address] Phone: [phone]  This note was generated in part with voice recognition software and I apologize for any typographical errors that were not detected and corrected.  Electronically signed by Cassandra Gardener, PA at 01/02/2022 2:33 PM EDT

## 2022-01-15 ENCOUNTER — Other Ambulatory Visit: Payer: Self-pay

## 2022-01-15 ENCOUNTER — Ambulatory Visit: Payer: Medicare HMO | Admitting: Certified Registered"

## 2022-01-15 ENCOUNTER — Observation Stay
Admission: RE | Admit: 2022-01-15 | Discharge: 2022-01-16 | Disposition: A | Discharge status: Home Health | Payer: Medicare HMO | Attending: Orthopedic Surgery | Admitting: Orthopedic Surgery

## 2022-01-15 ENCOUNTER — Observation Stay: Payer: Medicare HMO

## 2022-01-15 ENCOUNTER — Encounter
Admission: RE | Disposition: A | Discharge status: Home Health | Payer: Self-pay | Source: Home / Self Care | Attending: Orthopedic Surgery

## 2022-01-15 ENCOUNTER — Encounter: Payer: Self-pay | Admitting: Orthopedic Surgery

## 2022-01-15 DIAGNOSIS — M1711 Unilateral primary osteoarthritis, right knee: Secondary | ICD-10-CM | POA: Diagnosis not present

## 2022-01-15 DIAGNOSIS — R7302 Impaired glucose tolerance (oral): Secondary | ICD-10-CM

## 2022-01-15 DIAGNOSIS — Z7982 Long term (current) use of aspirin: Secondary | ICD-10-CM | POA: Insufficient documentation

## 2022-01-15 DIAGNOSIS — I10 Essential (primary) hypertension: Secondary | ICD-10-CM | POA: Diagnosis not present

## 2022-01-15 DIAGNOSIS — Z87891 Personal history of nicotine dependence: Secondary | ICD-10-CM | POA: Insufficient documentation

## 2022-01-15 DIAGNOSIS — Z79899 Other long term (current) drug therapy: Secondary | ICD-10-CM | POA: Diagnosis not present

## 2022-01-15 DIAGNOSIS — Z96651 Presence of right artificial knee joint: Secondary | ICD-10-CM

## 2022-01-15 DIAGNOSIS — Z96659 Presence of unspecified artificial knee joint: Secondary | ICD-10-CM

## 2022-01-15 HISTORY — PX: KNEE ARTHROPLASTY: SHX992

## 2022-01-15 LAB — ABO/RH: ABO/RH(D): A POS

## 2022-01-15 SURGERY — ARTHROPLASTY, KNEE, TOTAL, USING IMAGELESS COMPUTER-ASSISTED NAVIGATION
Anesthesia: General | Site: Knee | Laterality: Right

## 2022-01-15 MED ORDER — HYDROCHLOROTHIAZIDE 25 MG PO TABS
25.0000 mg | ORAL_TABLET | Freq: Every day | ORAL | Status: DC
  Administered 2022-01-16: 25 mg via ORAL
  Filled 2022-01-15: qty 1

## 2022-01-15 MED ORDER — ORAL CARE MOUTH RINSE: 15.0000 mL | Freq: Once | OROMUCOSAL | Status: AC

## 2022-01-15 MED ORDER — CELECOXIB 200 MG PO CAPS
ORAL_CAPSULE | ORAL | Status: AC
  Administered 2022-01-15: 400 mg via ORAL
  Filled 2022-01-15: qty 2

## 2022-01-15 MED ORDER — IRBESARTAN 150 MG PO TABS
300.0000 mg | ORAL_TABLET | Freq: Every day | ORAL | Status: DC
  Administered 2022-01-16: 300 mg via ORAL
  Filled 2022-01-15: qty 2

## 2022-01-15 MED ORDER — ACETAMINOPHEN 10 MG/ML IV SOLN
1000.0000 mg | Freq: Four times a day (QID) | INTRAVENOUS | Status: DC
  Administered 2022-01-16 (×3): 1000 mg via INTRAVENOUS
  Filled 2022-01-15 (×3): qty 100

## 2022-01-15 MED ORDER — ACETAMINOPHEN 10 MG/ML IV SOLN
INTRAVENOUS | Status: AC
  Filled 2022-01-15: qty 100

## 2022-01-15 MED ORDER — MIDAZOLAM HCL 5 MG/5ML IJ SOLN
INTRAMUSCULAR | Status: DC | PRN
  Administered 2022-01-15 (×2): 1 mg via INTRAVENOUS

## 2022-01-15 MED ORDER — FENTANYL CITRATE (PF) 100 MCG/2ML IJ SOLN: 25.0000 ug | INTRAMUSCULAR | Status: DC | PRN

## 2022-01-15 MED ORDER — BUPIVACAINE HCL (PF) 0.5 % IJ SOLN
INTRAMUSCULAR | Status: DC | PRN
  Administered 2022-01-15: 3 mL

## 2022-01-15 MED ORDER — TRANEXAMIC ACID-NACL 1000-0.7 MG/100ML-% IV SOLN
INTRAVENOUS | Status: AC
  Administered 2022-01-15: 1000 mg via INTRAVENOUS
  Filled 2022-01-15: qty 100

## 2022-01-15 MED ORDER — OXYCODONE HCL 5 MG PO TABS
5.0000 mg | ORAL_TABLET | ORAL | Status: DC | PRN
  Administered 2022-01-16: 5 mg via ORAL
  Filled 2022-01-15: qty 1

## 2022-01-15 MED ORDER — LACTATED RINGERS IV SOLN: INTRAVENOUS | Status: DC

## 2022-01-15 MED ORDER — 0.9 % SODIUM CHLORIDE (POUR BTL) OPTIME: TOPICAL | Status: DC | PRN

## 2022-01-15 MED ORDER — KETAMINE HCL 50 MG/5ML IJ SOSY
PREFILLED_SYRINGE | INTRAMUSCULAR | Status: AC
  Filled 2022-01-15: qty 5

## 2022-01-15 MED ORDER — ACETAMINOPHEN 10 MG/ML IV SOLN
1000.0000 mg | Freq: Once | INTRAVENOUS | Status: DC | PRN
  Administered 2022-01-15: 1000 mg via INTRAVENOUS

## 2022-01-15 MED ORDER — MIDAZOLAM HCL 2 MG/2ML IJ SOLN
INTRAMUSCULAR | Status: AC
  Filled 2022-01-15: qty 2

## 2022-01-15 MED ORDER — TRANEXAMIC ACID-NACL 1000-0.7 MG/100ML-% IV SOLN
INTRAVENOUS | Status: AC
  Filled 2022-01-15: qty 100

## 2022-01-15 MED ORDER — CELECOXIB 200 MG PO CAPS
200.0000 mg | ORAL_CAPSULE | Freq: Two times a day (BID) | ORAL | Status: DC
  Administered 2022-01-15 – 2022-01-16 (×2): 200 mg via ORAL
  Filled 2022-01-15 (×2): qty 1

## 2022-01-15 MED ORDER — LIDOCAINE HCL (CARDIAC) PF 100 MG/5ML IV SOSY
PREFILLED_SYRINGE | INTRAVENOUS | Status: DC | PRN
  Administered 2022-01-15: 60 mg via INTRAVENOUS

## 2022-01-15 MED ORDER — ONDANSETRON HCL 4 MG/2ML IJ SOLN: 4.0000 mg | Freq: Four times a day (QID) | INTRAMUSCULAR | Status: DC | PRN

## 2022-01-15 MED ORDER — CARVEDILOL 12.5 MG PO TABS
12.5000 mg | ORAL_TABLET | Freq: Two times a day (BID) | ORAL | Status: DC
  Administered 2022-01-16: 12.5 mg via ORAL
  Filled 2022-01-15: qty 1

## 2022-01-15 MED ORDER — SODIUM CHLORIDE (PF) 0.9 % IJ SOLN
INTRAMUSCULAR | Status: DC | PRN
  Administered 2022-01-15: 120 mL

## 2022-01-15 MED ORDER — PHENYLEPHRINE HCL-NACL 20-0.9 MG/250ML-% IV SOLN
INTRAVENOUS | Status: DC | PRN
  Administered 2022-01-15: 50 ug/min via INTRAVENOUS

## 2022-01-15 MED ORDER — TRAMADOL HCL 50 MG PO TABS: 50.0000 mg | ORAL_TABLET | ORAL | Status: DC | PRN

## 2022-01-15 MED ORDER — OXYCODONE HCL 5 MG PO TABS
ORAL_TABLET | ORAL | Status: AC
  Filled 2022-01-15: qty 2

## 2022-01-15 MED ORDER — PROPOFOL 500 MG/50ML IV EMUL
INTRAVENOUS | Status: DC | PRN
  Administered 2022-01-15: 50 ug/kg/min via INTRAVENOUS

## 2022-01-15 MED ORDER — ONDANSETRON HCL 4 MG/2ML IJ SOLN: 4.0000 mg | Freq: Once | INTRAMUSCULAR | Status: DC | PRN

## 2022-01-15 MED ORDER — KETAMINE HCL 10 MG/ML IJ SOLN
INTRAMUSCULAR | Status: DC | PRN
  Administered 2022-01-15: 5 mg via INTRAVENOUS
  Administered 2022-01-15: 10 mg via INTRAVENOUS

## 2022-01-15 MED ORDER — GABAPENTIN 300 MG PO CAPS
ORAL_CAPSULE | ORAL | Status: AC
  Administered 2022-01-15: 300 mg via ORAL
  Filled 2022-01-15: qty 1

## 2022-01-15 MED ORDER — BUPIVACAINE LIPOSOME 1.3 % IJ SUSP
INTRAMUSCULAR | Status: AC
  Filled 2022-01-15: qty 20

## 2022-01-15 MED ORDER — OXYCODONE HCL 5 MG/5ML PO SOLN: 5.0000 mg | Freq: Once | ORAL | Status: DC | PRN

## 2022-01-15 MED ORDER — ROSUVASTATIN CALCIUM 10 MG PO TABS
20.0000 mg | ORAL_TABLET | Freq: Every day | ORAL | Status: DC
  Administered 2022-01-16: 20 mg via ORAL
  Filled 2022-01-15: qty 2

## 2022-01-15 MED ORDER — PANTOPRAZOLE SODIUM 40 MG PO TBEC
40.0000 mg | DELAYED_RELEASE_TABLET | Freq: Two times a day (BID) | ORAL | Status: DC
  Administered 2022-01-15 – 2022-01-16 (×2): 40 mg via ORAL
  Filled 2022-01-15 (×2): qty 1

## 2022-01-15 MED ORDER — ACETAMINOPHEN 325 MG PO TABS
325.0000 mg | ORAL_TABLET | Freq: Four times a day (QID) | ORAL | Status: DC | PRN
  Filled 2022-01-15: qty 2

## 2022-01-15 MED ORDER — CEFAZOLIN SODIUM-DEXTROSE 2-4 GM/100ML-% IV SOLN
INTRAVENOUS | Status: AC
  Administered 2022-01-15: 2 g via INTRAVENOUS
  Filled 2022-01-15: qty 100

## 2022-01-15 MED ORDER — CEFAZOLIN SODIUM-DEXTROSE 2-4 GM/100ML-% IV SOLN
2.0000 g | INTRAVENOUS | Status: AC
  Administered 2022-01-15: 2 g via INTRAVENOUS

## 2022-01-15 MED ORDER — SODIUM CHLORIDE FLUSH 0.9 % IV SOLN
INTRAVENOUS | Status: AC
  Filled 2022-01-15: qty 30

## 2022-01-15 MED ORDER — FAMOTIDINE 20 MG PO TABS: 20.0000 mg | ORAL_TABLET | Freq: Once | ORAL | Status: AC

## 2022-01-15 MED ORDER — SODIUM CHLORIDE 0.9 % IR SOLN
Status: DC | PRN
  Administered 2022-01-15: 3000 mL

## 2022-01-15 MED ORDER — OMEGA-3-ACID ETHYL ESTERS 1 G PO CAPS
1.0000 g | ORAL_CAPSULE | Freq: Every day | ORAL | Status: DC
  Administered 2022-01-16: 1 g via ORAL
  Filled 2022-01-15: qty 1

## 2022-01-15 MED ORDER — TRANEXAMIC ACID-NACL 1000-0.7 MG/100ML-% IV SOLN
1000.0000 mg | INTRAVENOUS | Status: AC
  Administered 2022-01-15: 1000 mg via INTRAVENOUS

## 2022-01-15 MED ORDER — TRANEXAMIC ACID-NACL 1000-0.7 MG/100ML-% IV SOLN: 1000.0000 mg | Freq: Once | INTRAVENOUS | Status: AC

## 2022-01-15 MED ORDER — METOCLOPRAMIDE HCL 10 MG PO TABS
10.0000 mg | ORAL_TABLET | Freq: Three times a day (TID) | ORAL | Status: DC
  Administered 2022-01-16 (×2): 10 mg via ORAL
  Filled 2022-01-15 (×5): qty 1

## 2022-01-15 MED ORDER — ALUM & MAG HYDROXIDE-SIMETH 200-200-20 MG/5ML PO SUSP: 30.0000 mL | ORAL | Status: DC | PRN

## 2022-01-15 MED ORDER — OXYCODONE HCL 5 MG PO TABS
10.0000 mg | ORAL_TABLET | ORAL | Status: DC | PRN
  Administered 2022-01-15 (×2): 10 mg via ORAL
  Filled 2022-01-15: qty 2

## 2022-01-15 MED ORDER — GLYCOPYRROLATE 0.2 MG/ML IJ SOLN
INTRAMUSCULAR | Status: DC | PRN
  Administered 2022-01-15: .2 mg via INTRAVENOUS

## 2022-01-15 MED ORDER — VITAMIN D 25 MCG (1000 UNIT) PO TABS
1000.0000 [IU] | ORAL_TABLET | Freq: Every day | ORAL | Status: DC
  Administered 2022-01-16: 1000 [IU] via ORAL
  Filled 2022-01-15: qty 1

## 2022-01-15 MED ORDER — BISACODYL 10 MG RE SUPP: 10.0000 mg | Freq: Every day | RECTAL | Status: DC | PRN

## 2022-01-15 MED ORDER — CELECOXIB 200 MG PO CAPS
400.0000 mg | ORAL_CAPSULE | Freq: Once | ORAL | Status: AC
Start: 2022-01-15 — End: 2022-01-15

## 2022-01-15 MED ORDER — FERROUS SULFATE 325 (65 FE) MG PO TABS
325.0000 mg | ORAL_TABLET | Freq: Two times a day (BID) | ORAL | Status: DC
  Administered 2022-01-16: 325 mg via ORAL
  Filled 2022-01-15: qty 1

## 2022-01-15 MED ORDER — ONDANSETRON HCL 4 MG PO TABS: 4.0000 mg | ORAL_TABLET | Freq: Four times a day (QID) | ORAL | Status: DC | PRN

## 2022-01-15 MED ORDER — BUPIVACAINE HCL (PF) 0.25 % IJ SOLN
INTRAMUSCULAR | Status: AC
  Filled 2022-01-15: qty 60

## 2022-01-15 MED ORDER — ADULT MULTIVITAMIN W/MINERALS CH
1.0000 | ORAL_TABLET | Freq: Every day | ORAL | Status: DC
  Administered 2022-01-16: 1 via ORAL
  Filled 2022-01-15: qty 1

## 2022-01-15 MED ORDER — HYDROMORPHONE HCL 1 MG/ML IJ SOLN: 0.5000 mg | INTRAMUSCULAR | Status: DC | PRN

## 2022-01-15 MED ORDER — DEXAMETHASONE SODIUM PHOSPHATE 10 MG/ML IJ SOLN: 8.0000 mg | Freq: Once | INTRAMUSCULAR | Status: AC

## 2022-01-15 MED ORDER — DIPHENHYDRAMINE HCL 12.5 MG/5ML PO ELIX: 12.5000 mg | ORAL_SOLUTION | ORAL | Status: DC | PRN

## 2022-01-15 MED ORDER — CEFAZOLIN SODIUM-DEXTROSE 2-4 GM/100ML-% IV SOLN
INTRAVENOUS | Status: AC
  Filled 2022-01-15: qty 100

## 2022-01-15 MED ORDER — GABAPENTIN 300 MG PO CAPS: 300.0000 mg | ORAL_CAPSULE | Freq: Once | ORAL | Status: AC

## 2022-01-15 MED ORDER — CHLORHEXIDINE GLUCONATE 4 % EX LIQD
60.0000 mL | Freq: Once | CUTANEOUS | Status: AC
  Administered 2022-01-15: 4 via TOPICAL

## 2022-01-15 MED ORDER — FENTANYL CITRATE (PF) 100 MCG/2ML IJ SOLN
INTRAMUSCULAR | Status: AC
  Filled 2022-01-15: qty 2

## 2022-01-15 MED ORDER — SENNOSIDES-DOCUSATE SODIUM 8.6-50 MG PO TABS
1.0000 | ORAL_TABLET | Freq: Two times a day (BID) | ORAL | Status: DC
  Administered 2022-01-15 – 2022-01-16 (×2): 1 via ORAL
  Filled 2022-01-15 (×2): qty 1

## 2022-01-15 MED ORDER — PHENOL 1.4 % MT LIQD: 1.0000 | OROMUCOSAL | Status: DC | PRN

## 2022-01-15 MED ORDER — FAMOTIDINE 20 MG PO TABS
ORAL_TABLET | ORAL | Status: AC
  Administered 2022-01-15: 20 mg via ORAL
  Filled 2022-01-15: qty 1

## 2022-01-15 MED ORDER — FLEET ENEMA 7-19 GM/118ML RE ENEM
1.0000 | ENEMA | Freq: Once | RECTAL | Status: DC | PRN
Start: 2022-01-15 — End: 2022-01-16

## 2022-01-15 MED ORDER — SODIUM CHLORIDE 0.9 % IV SOLN: INTRAVENOUS | Status: DC

## 2022-01-15 MED ORDER — CEFAZOLIN SODIUM-DEXTROSE 2-4 GM/100ML-% IV SOLN
2.0000 g | Freq: Four times a day (QID) | INTRAVENOUS | Status: AC
  Administered 2022-01-16: 2 g via INTRAVENOUS

## 2022-01-15 MED ORDER — PROPOFOL 10 MG/ML IV BOLUS
INTRAVENOUS | Status: DC | PRN
  Administered 2022-01-15: 20 mg via INTRAVENOUS

## 2022-01-15 MED ORDER — ENOXAPARIN SODIUM 30 MG/0.3ML IJ SOSY
30.0000 mg | PREFILLED_SYRINGE | Freq: Two times a day (BID) | INTRAMUSCULAR | Status: DC
  Administered 2022-01-16: 30 mg via SUBCUTANEOUS
  Filled 2022-01-15: qty 0.3

## 2022-01-15 MED ORDER — OYSTER SHELL CALCIUM/D3 500-5 MG-MCG PO TABS
1.0000 | ORAL_TABLET | Freq: Every day | ORAL | Status: DC
  Administered 2022-01-16: 1 via ORAL
  Filled 2022-01-15: qty 1

## 2022-01-15 MED ORDER — CHLORHEXIDINE GLUCONATE 0.12 % MT SOLN
OROMUCOSAL | Status: AC
  Administered 2022-01-15: 15 mL via OROMUCOSAL
  Filled 2022-01-15: qty 15

## 2022-01-15 MED ORDER — MENTHOL 3 MG MT LOZG: 1.0000 | LOZENGE | OROMUCOSAL | Status: DC | PRN

## 2022-01-15 MED ORDER — FENTANYL CITRATE (PF) 100 MCG/2ML IJ SOLN
INTRAMUSCULAR | Status: DC | PRN
  Administered 2022-01-15: 25 ug via INTRAVENOUS

## 2022-01-15 MED ORDER — MAGNESIUM HYDROXIDE 400 MG/5ML PO SUSP
30.0000 mL | Freq: Every day | ORAL | Status: DC
  Administered 2022-01-16: 30 mL via ORAL
  Filled 2022-01-15: qty 30

## 2022-01-15 MED ORDER — DEXAMETHASONE SODIUM PHOSPHATE 10 MG/ML IJ SOLN
INTRAMUSCULAR | Status: AC
  Administered 2022-01-15: 8 mg via INTRAVENOUS
  Filled 2022-01-15: qty 1

## 2022-01-15 MED ORDER — CHLORHEXIDINE GLUCONATE 0.12 % MT SOLN: 15.0000 mL | Freq: Once | OROMUCOSAL | Status: AC

## 2022-01-15 MED ORDER — ENSURE PRE-SURGERY PO LIQD
296.0000 mL | Freq: Once | ORAL | Status: AC
  Administered 2022-01-15: 296 mL via ORAL
  Filled 2022-01-15: qty 296

## 2022-01-15 MED ORDER — SURGIPHOR WOUND IRRIGATION SYSTEM - OPTIME
TOPICAL | Status: DC | PRN
  Administered 2022-01-15: 450 mL

## 2022-01-15 MED ORDER — OXYCODONE HCL 5 MG PO TABS: 5.0000 mg | ORAL_TABLET | Freq: Once | ORAL | Status: DC | PRN

## 2022-01-15 MED ORDER — TELMISARTAN-HCTZ 80-25 MG PO TABS: 1.0000 | ORAL_TABLET | Freq: Every day | ORAL | Status: DC

## 2022-01-15 SURGICAL SUPPLY — 70 items
ATTUNE PSFEM RTSZ5 NARCEM KNEE (Femur) ×1 IMPLANT
ATTUNE PSRP INSR SZ 5 10M KNEE (Insert) ×1 IMPLANT
BASE TIBIAL ROT PLAT SZ 5 KNEE (Knees) IMPLANT
BATTERY INSTRU NAVIGATION (MISCELLANEOUS) ×8 IMPLANT
BLADE SAW 70X12.5 (BLADE) ×2 IMPLANT
BLADE SAW 90X13X1.19 OSCILLAT (BLADE) ×2 IMPLANT
BLADE SAW 90X25X1.19 OSCILLAT (BLADE) ×2 IMPLANT
BONE CEMENT GENTAMICIN (Cement) ×4 IMPLANT
CEMENT BONE GENTAMICIN 40 (Cement) IMPLANT
COOLER POLAR GLACIER W/PUMP (MISCELLANEOUS) ×2 IMPLANT
CUFF TOURN SGL QUICK 34 (TOURNIQUET CUFF)
CUFF TRNQT CYL 34X4.125X (TOURNIQUET CUFF) IMPLANT
DRAPE 3/4 80X56 (DRAPES) ×2 IMPLANT
DRAPE INCISE IOBAN 66X45 STRL (DRAPES) ×1 IMPLANT
DRSG DERMACEA 8X12 NADH (GAUZE/BANDAGES/DRESSINGS) ×1 IMPLANT
DRSG DERMACEA NONADH 3X8 (GAUZE/BANDAGES/DRESSINGS) ×2 IMPLANT
DRSG MEPILEX SACRM 8.7X9.8 (GAUZE/BANDAGES/DRESSINGS) ×2 IMPLANT
DRSG OPSITE POSTOP 4X14 (GAUZE/BANDAGES/DRESSINGS) ×2 IMPLANT
DRSG TEGADERM 4X4.75 (GAUZE/BANDAGES/DRESSINGS) ×2 IMPLANT
DURAPREP 26ML APPLICATOR (WOUND CARE) ×4 IMPLANT
ELECT CAUTERY BLADE 6.4 (BLADE) ×2 IMPLANT
ELECT REM PT RETURN 9FT ADLT (ELECTROSURGICAL) ×2
ELECTRODE REM PT RTRN 9FT ADLT (ELECTROSURGICAL) ×1 IMPLANT
EX-PIN ORTHOLOCK NAV 4X150 (PIN) ×4 IMPLANT
GLOVE BIOGEL M STRL SZ7.5 (GLOVE) ×8 IMPLANT
GLOVE BIOGEL PI IND STRL 8 (GLOVE) ×1 IMPLANT
GLOVE BIOGEL PI INDICATOR 8 (GLOVE) ×1
GLOVE SURG UNDER POLY LF SZ7.5 (GLOVE) ×2 IMPLANT
GOWN STRL REUS W/ TWL LRG LVL3 (GOWN DISPOSABLE) ×2 IMPLANT
GOWN STRL REUS W/ TWL XL LVL3 (GOWN DISPOSABLE) ×1 IMPLANT
GOWN STRL REUS W/TWL LRG LVL3 (GOWN DISPOSABLE) ×2
GOWN STRL REUS W/TWL XL LVL3 (GOWN DISPOSABLE) ×1
HEMOVAC 400CC 10FR (MISCELLANEOUS) ×2 IMPLANT
HOLDER FOLEY CATH W/STRAP (MISCELLANEOUS) ×2 IMPLANT
HOLSTER ELECTROSUGICAL PENCIL (MISCELLANEOUS) ×2 IMPLANT
HOOD PEEL AWAY FLYTE STAYCOOL (MISCELLANEOUS) ×4 IMPLANT
IV NS IRRIG 3000ML ARTHROMATIC (IV SOLUTION) ×2 IMPLANT
KIT TURNOVER KIT A (KITS) ×2 IMPLANT
KNIFE SCULPS 14X20 (INSTRUMENTS) ×2 IMPLANT
MANIFOLD NEPTUNE II (INSTRUMENTS) ×4 IMPLANT
NDL SPNL 20GX3.5 QUINCKE YW (NEEDLE) ×2 IMPLANT
NEEDLE SPNL 20GX3.5 QUINCKE YW (NEEDLE) ×4 IMPLANT
NS IRRIG 500ML POUR BTL (IV SOLUTION) ×2 IMPLANT
PACK TOTAL KNEE (MISCELLANEOUS) ×2 IMPLANT
PAD ABD DERMACEA PRESS 5X9 (GAUZE/BANDAGES/DRESSINGS) ×3 IMPLANT
PAD WRAPON POLAR KNEE (MISCELLANEOUS) ×1 IMPLANT
PATELLA MEDIAL ATTUN 35MM KNEE (Knees) ×1 IMPLANT
PIN DRILL FIX HALF THREAD (BIT) ×4 IMPLANT
PIN FIXATION 1/8DIA X 3INL (PIN) ×2 IMPLANT
PULSAVAC PLUS IRRIG FAN TIP (DISPOSABLE) ×2
SOL PREP PVP 2OZ (MISCELLANEOUS) ×2
SOLUTION IRRIG SURGIPHOR (IV SOLUTION) ×2 IMPLANT
SOLUTION PREP PVP 2OZ (MISCELLANEOUS) ×1 IMPLANT
SPONGE DRAIN TRACH 4X4 STRL 2S (GAUZE/BANDAGES/DRESSINGS) ×2 IMPLANT
STAPLER SKIN PROX 35W (STAPLE) ×2 IMPLANT
STOCKINETTE BIAS CUT 6 980064 (GAUZE/BANDAGES/DRESSINGS) ×1 IMPLANT
STRAP TIBIA SHORT (MISCELLANEOUS) ×2 IMPLANT
SUCTION FRAZIER HANDLE 10FR (MISCELLANEOUS) ×1
SUCTION TUBE FRAZIER 10FR DISP (MISCELLANEOUS) ×1 IMPLANT
SUT VIC AB 0 CT1 36 (SUTURE) ×4 IMPLANT
SUT VIC AB 1 CT1 36 (SUTURE) ×4 IMPLANT
SUT VIC AB 2-0 CT2 27 (SUTURE) ×2 IMPLANT
SYR 30ML LL (SYRINGE) ×4 IMPLANT
TIBIAL BASE ROT PLAT SZ 5 KNEE (Knees) ×2 IMPLANT
TIP FAN IRRIG PULSAVAC PLUS (DISPOSABLE) ×1 IMPLANT
TOWEL OR 17X26 4PK STRL BLUE (TOWEL DISPOSABLE) IMPLANT
TOWER CARTRIDGE SMART MIX (DISPOSABLE) ×2 IMPLANT
TRAY FOLEY MTR SLVR 16FR STAT (SET/KITS/TRAYS/PACK) ×2 IMPLANT
WATER STERILE IRR 500ML POUR (IV SOLUTION) ×2 IMPLANT
WRAPON POLAR PAD KNEE (MISCELLANEOUS) ×2

## 2022-01-15 NOTE — Progress Notes (Signed)
PT Cancellation Note  Patient Details Name: Cassandra Ball MRN: [id number] DOB: 11/22/1940   Cancelled Treatment:    Reason Eval/Treat Not Completed: Other (comment). Consult received and chart reviewed. Per discussion with nursing, pt with no sensation return at this time. Will hold off mobility attempts until next date.   Yuli Lanigan 01/15/2022, 3:30 PM Elizabeth Palau, PT, DPT, GCS [phone]

## 2022-01-15 NOTE — Transfer of Care (Signed)
Immediate Anesthesia Transfer of Care Note  Patient: Cassandra Ball  Procedure(s) Performed: COMPUTER ASSISTED TOTAL KNEE ARTHROPLASTY (Right: Knee)  Patient Location: PACU  Anesthesia Type:Spinal  Level of Consciousness: drowsy  Airway & Oxygen Therapy: Patient Spontanous Breathing  Post-op Assessment: Report given to RN and Post -op Vital signs reviewed and stable  Post vital signs: Reviewed and stable  Last Vitals:  Vitals Value Taken Time  BP 106/51 01/15/22 1430  Temp    Pulse 59 01/15/22 1432  Resp 15 01/15/22 1432  SpO2 96 % 01/15/22 1432  Vitals shown include unvalidated device data.  Last Pain:  Vitals:   01/15/22 0930  TempSrc: Temporal  PainSc: 0-No pain         Complications: No notable events documented.

## 2022-01-15 NOTE — H&P (Signed)
The patient has been re-examined, and the chart reviewed, and there have been no interval changes to the documented history and physical.    The risks, benefits, and alternatives have been discussed at length. The patient expressed understanding of the risks benefits and agreed with plans for surgical intervention.  Aliegha Paullin P. Stephon Weathers, Jr. M.D.    

## 2022-01-15 NOTE — Op Note (Signed)
OPERATIVE NOTE  DATE OF SURGERY:  01/15/2022  PATIENT NAME:  Cassandra Ball   DOB: May 11, 1941  MRN: [id number]  PRE-OPERATIVE DIAGNOSIS: Degenerative arthrosis of the right knee, primary  POST-OPERATIVE DIAGNOSIS:  Same  PROCEDURE:  Right total knee arthroplasty using computer-assisted navigation  SURGEON:  Marciano Sequin. M.D.  ASSISTANT: Cassell Smiles, PA-C (present and scrubbed throughout the case, critical for assistance with exposure, retraction, instrumentation, and closure)  ANESTHESIA: spinal  ESTIMATED BLOOD LOSS: 50 mL  FLUIDS REPLACED: 1000 mL of crystalloid  TOURNIQUET TIME: 75 minutes  DRAINS: 2 medium Hemovac drains  SOFT TISSUE RELEASES: Anterior cruciate ligament, posterior cruciate ligament, deep medial collateral ligament, patellofemoral ligament  IMPLANTS UTILIZED: DePuy Attune size 5N posterior stabilized femoral component (cemented), size 5 rotating platform tibial component (cemented), 35 mm medialized dome patella (cemented), and a 10 mm stabilized rotating platform polyethylene insert.  INDICATIONS FOR SURGERY: Cassandra Ball is a 81 y.o. year old female with a long history of progressive knee pain. X-rays demonstrated severe degenerative changes in tricompartmental fashion. The patient had not seen any significant improvement despite conservative nonsurgical intervention. After discussion of the risks and benefits of surgical intervention, the patient expressed understanding of the risks benefits and agree with plans for total knee arthroplasty.   The risks, benefits, and alternatives were discussed at length including but not limited to the risks of infection, bleeding, nerve injury, stiffness, blood clots, the need for revision surgery, cardiopulmonary complications, among others, and they were willing to proceed.  PROCEDURE IN DETAIL: The patient was brought into the operating room and, after adequate spinal anesthesia was achieved, a tourniquet was  placed on the patient's upper thigh. The patient's knee and leg were cleaned and prepped with alcohol and DuraPrep and draped in the usual sterile fashion. A "timeout" was performed as per usual protocol. The lower extremity was exsanguinated using an Esmarch, and the tourniquet was inflated to 300 mmHg. An anterior longitudinal incision was made followed by a standard mid vastus approach. The deep fibers of the medial collateral ligament were elevated in a subperiosteal fashion off of the medial flare of the tibia so as to maintain a continuous soft tissue sleeve. The patella was subluxed laterally and the patellofemoral ligament was incised. Inspection of the knee demonstrated severe degenerative changes with full-thickness loss of articular cartilage. Osteophytes were debrided using a rongeur. Anterior and posterior cruciate ligaments were excised. Two 4.0 mm Schanz pins were inserted in the femur and into the tibia for attachment of the array of trackers used for computer-assisted navigation. Hip center was identified using a circumduction technique. Distal landmarks were mapped using the computer. The distal femur and proximal tibia were mapped using the computer. The distal femoral cutting guide was positioned using computer-assisted navigation so as to achieve a 5 distal valgus cut. The femur was sized and it was felt that a size 5N femoral component was appropriate. A size 5 femoral cutting guide was positioned and the anterior cut was performed and verified using the computer. This was followed by completion of the posterior and chamfer cuts. Femoral cutting guide for the central box was then positioned in the center box cut was performed.  Attention was then directed to the proximal tibia. Medial and lateral menisci were excised. The extramedullary tibial cutting guide was positioned using computer-assisted navigation so as to achieve a 0 varus-valgus alignment and 3 posterior slope. The cut was  performed and verified using the computer. The proximal tibia was  sized and it was felt that a size 5 tibial tray was appropriate. Tibial and femoral trials were inserted followed by insertion of a 10 mm polyethylene insert. This allowed for excellent mediolateral soft tissue balancing both in flexion and in full extension. Finally, the patella was cut and prepared so as to accommodate a 35 mm medialized dome patella. A patella trial was placed and the knee was placed through a range of motion with excellent patellar tracking appreciated. The femoral trial was removed after debridement of posterior osteophytes. The central post-hole for the tibial component was reamed followed by insertion of a keel punch. Tibial trials were then removed. Cut surfaces of bone were irrigated with copious amounts of normal saline using pulsatile lavage and then suctioned dry. Polymethylmethacrylate cement with gentamicin was prepared in the usual fashion using a vacuum mixer. Cement was applied to the cut surface of the proximal tibia as well as along the undersurface of a size 5 rotating platform tibial component. Tibial component was positioned and impacted into place. Excess cement was removed using [employer] fast streamer. Cement was then applied to the cut surfaces of the femur as well as along the posterior flanges of the size 5N femoral component. The femoral component was positioned and impacted into place. Excess cement was removed using [employer] fast streamer. A 10 mm polyethylene trial was inserted and the knee was brought into full extension with steady axial compression applied. Finally, cement was applied to the backside of a 35 mm medialized dome patella and the patellar component was positioned and patellar clamp applied. Excess cement was removed using [employer] fast streamer. After adequate curing of the cement, the tourniquet was deflated after a total tourniquet time of 75 minutes. Hemostasis was achieved using electrocautery. The knee  was irrigated with copious amounts of normal saline using pulsatile lavage followed by 450 ml of Surgiphor and then suctioned dry. 20 mL of 1.3% Exparel and 60 mL of 0.25% Marcaine in 40 mL of normal saline was injected along the posterior capsule, medial and lateral gutters, and along the arthrotomy site. A 10 mm stabilized rotating platform polyethylene insert was inserted and the knee was placed through a range of motion with excellent mediolateral soft tissue balancing appreciated and excellent patellar tracking noted. 2 medium drains were placed in the wound bed and brought out through separate stab incisions. The medial parapatellar portion of the incision was reapproximated using interrupted sutures of #1 Vicryl. Subcutaneous tissue was approximated in layers using first #0 Vicryl followed #2-0 Vicryl. The skin was approximated with skin staples. A sterile dressing was applied.  The patient tolerated the procedure well and was transported to the recovery room in stable condition.    Kelena Garrow P. Holley Bouche., M.D.

## 2022-01-15 NOTE — Anesthesia Postprocedure Evaluation (Signed)
Anesthesia Post Note  Patient: Cassandra Ball  Procedure(s) Performed: COMPUTER ASSISTED TOTAL KNEE ARTHROPLASTY (Right: Knee)  Patient location during evaluation: PACU Anesthesia Type: Spinal Level of consciousness: awake and alert, oriented and patient cooperative Pain management: pain level controlled Vital Signs Assessment: post-procedure vital signs reviewed and stable Respiratory status: spontaneous breathing, nonlabored ventilation and respiratory function stable Cardiovascular status: blood pressure returned to baseline and stable Postop Assessment: adequate PO intake, no headache and spinal receding Anesthetic complications: no   No notable events documented.   Last Vitals:  Vitals:   01/15/22 1455 01/15/22 1500  BP:  138/61  Pulse: (!) 53 69  Resp: 12 14  Temp:  (!) 36.4 C  SpO2: 96% 98%    Last Pain:  Vitals:   01/15/22 1500  TempSrc:   PainSc: 3     LLE Motor Response: Other (Comment) (01/15/22 1500) LLE Sensation: Pain (01/15/22 1500) RLE Motor Response: Other (Comment) (01/15/22 1500) RLE Sensation: Pain (01/15/22 1500)      Reed Breech

## 2022-01-15 NOTE — Anesthesia Procedure Notes (Signed)
Spinal  Patient location during procedure: OR Start time: 01/15/2022 11:10 AM End time: 01/15/2022 11:13 AM Reason for block: surgical anesthesia Staffing Performed: resident/CRNA  Anesthesiologist: Darrin Nipper, MD Resident/CRNA: Beverely Low, CRNA Performed by: Beverely Low, CRNA Authorized by: Darrin Nipper, MD   Preanesthetic Checklist Completed: patient identified, IV checked, site marked, risks and benefits discussed, surgical consent, monitors and equipment checked, pre-op evaluation and timeout performed Spinal Block Patient position: sitting Prep: Betadine Patient monitoring: heart rate, continuous pulse ox, blood pressure and cardiac monitor Approach: midline Location: L3-4 Injection technique: single-shot Needle Needle type: Whitacre and Introducer  Needle gauge: 24 G Needle length: 9 cm Assessment Sensory level: T12 Events: CSF return Additional Notes Negative paresthesia. Negative blood return. Positive free-flowing CSF. Expiration date of kit checked and confirmed. Patient tolerated procedure well, without complications.

## 2022-01-15 NOTE — Anesthesia Preprocedure Evaluation (Addendum)
Anesthesia Evaluation  Patient identified by MRN, date of birth, ID band Patient awake    Reviewed: Allergy & Precautions, NPO status , Patient's Chart, lab work & pertinent test results  History of Anesthesia Complications Negative for: history of anesthetic complications  Airway Mallampati: IV   Neck ROM: Full    Dental  (+) Missing   Pulmonary neg pulmonary ROS,    Pulmonary exam normal breath sounds clear to auscultation       Cardiovascular hypertension, Normal cardiovascular exam Rhythm:Regular Rate:Normal  ECG 01/03/22: normal   Neuro/Psych negative neurological ROS     GI/Hepatic negative GI ROS,   Endo/Other  negative endocrine ROS  Renal/GU negative Renal ROS     Musculoskeletal  (+) Arthritis ,   Abdominal   Peds  Hematology negative hematology ROS (+)   Anesthesia Other Findings   Reproductive/Obstetrics                            Anesthesia Physical Anesthesia Plan  ASA: 2  Anesthesia Plan: General and Spinal   Post-op Pain Management:    Induction: Intravenous  PONV Risk Score and Plan: 3 and Propofol infusion, TIVA, Treatment may vary due to age or medical condition and Ondansetron  Airway Management Planned: Natural Airway and Nasal Cannula  Additional Equipment:   Intra-op Plan:   Post-operative Plan:   Informed Consent: I have reviewed the patients History and Physical, chart, labs and discussed the procedure including the risks, benefits and alternatives for the proposed anesthesia with the patient or authorized representative who has indicated his/her understanding and acceptance.       Plan Discussed with: CRNA  Anesthesia Plan Comments: (Plan for spinal and GA with natural airway, LMA/GETA backup.  Patient consented for risks of anesthesia including but not limited to:  - adverse reactions to medications - damage to eyes, teeth, lips or other oral  mucosa - nerve damage due to positioning  - sore throat or hoarseness - headache, bleeding, infection, nerve damage 2/2 spinal - damage to heart, brain, nerves, lungs, other parts of body or loss of life  Informed patient about role of CRNA in peri- and intra-operative care.  Patient voiced understanding.)        Anesthesia Quick Evaluation

## 2022-01-16 ENCOUNTER — Encounter: Payer: Self-pay | Admitting: Orthopedic Surgery

## 2022-01-16 DIAGNOSIS — M1711 Unilateral primary osteoarthritis, right knee: Secondary | ICD-10-CM | POA: Diagnosis not present

## 2022-01-16 MED ORDER — ORAL CARE MOUTH RINSE: 15.0000 mL | OROMUCOSAL | Status: DC | PRN

## 2022-01-16 MED ORDER — OXYCODONE HCL 5 MG PO TABS
5.0000 mg | ORAL_TABLET | ORAL | 0 refills | Status: DC | PRN
Start: 2022-01-16 — End: 2022-03-12

## 2022-01-16 MED ORDER — ENOXAPARIN SODIUM 40 MG/0.4ML IJ SOSY: 40.0000 mg | PREFILLED_SYRINGE | INTRAMUSCULAR | 0 refills | Status: DC

## 2022-01-16 MED ORDER — TRAMADOL HCL 50 MG PO TABS
50.0000 mg | ORAL_TABLET | ORAL | 0 refills | Status: DC | PRN
Start: 2022-01-16 — End: 2022-03-12

## 2022-01-16 MED ORDER — CELECOXIB 200 MG PO CAPS: 200.0000 mg | ORAL_CAPSULE | Freq: Two times a day (BID) | ORAL | 0 refills | Status: DC

## 2022-01-16 NOTE — TOC Progression Note (Signed)
Transition of Care [facility]) - Progression Note    Patient Details  Name: Cassandra Ball MRN: [id number] Date of Birth: 10-18-1940  Transition of Care [facility]) CM/SW Contact  Marlowe Sax, RN Phone Number: 01/16/2022, 9:54 AM  Clinical Narrative:    Patient is set up with [facility] prior to Surgery by Surgeons office        Expected Discharge Plan and Services                                                 Social Determinants of Health (SDOH) Interventions    Readmission Risk Interventions     No data to display

## 2022-01-16 NOTE — Discharge Summary (Signed)
Physician Discharge Summary  Patient ID: CANDRA WEGNER MRN: [id number] DOB/AGE: 82-Oct-1942 83 y.o.  Admit date: 01/15/2022 Discharge date: 01/16/2022  Admission Diagnoses:  Total knee replacement status [Z96.659]  Surgeries:Procedure(s):  Right total knee arthroplasty using computer-assisted navigation   SURGEON:  Jena Gauss. M.D.   ASSISTANT: Baldwin Jamaica, PA-C (present and scrubbed throughout the case, critical for assistance with exposure, retraction, instrumentation, and closure)   ANESTHESIA: spinal   ESTIMATED BLOOD LOSS: 50 mL   FLUIDS REPLACED: 1000 mL of crystalloid   TOURNIQUET TIME: 75 minutes   DRAINS: 2 medium Hemovac drains   SOFT TISSUE RELEASES: Anterior cruciate ligament, posterior cruciate ligament, deep medial collateral ligament, patellofemoral ligament   IMPLANTS UTILIZED: DePuy Attune size 5N posterior stabilized femoral component (cemented), size 5 rotating platform tibial component (cemented), 35 mm medialized dome patella (cemented), and a 10 mm stabilized rotating platform polyethylene insert.  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 female who was admitted 01/15/2022 with a diagnosis of right knee osteoarthritis and went to the operating room on 01/15/2022 and underwent right total knee arthroplasty. The patient received  perioperative antibiotics for prophylaxis (see below). The patient tolerated the procedure well and was transported to PACU in stable condition. After meeting PACU criteria, the patient was subsequently transferred to the Orthopaedics/Rehabilitation unit.   The patient received DVT prophylaxis in the form of early mobilization, Lovenox, TED hose, and SCDs . A sacral pad had been placed and heels were elevated off of the bed with rolled towels in order to protect skin integrity. Foley catheter was discontinued on postoperative day #0. Wound drains were discontinued on postoperative day #1. The surgical incision was healing well without signs of infection.  Physical therapy was initiated postoperatively for transfers, gait training, and strengthening. Occupational therapy was initiated for activities of daily living and evaluation for assisted devices. Rehabilitation goals were reviewed in detail with the patient. The patient made steady progress with physical therapy and physical therapy recommended discharge to Home.   The patient achieved the preliminary goals of this hospitalization and was felt to be medically and orthopaedically appropriate for discharge.  She was given perioperative antibiotics:  Anti-infectives (From admission, onward)    Start     Dose/Rate Route Frequency Ordered Stop   01/15/22 1700  ceFAZolin (ANCEF) IVPB 2g/100 mL premix        2 g 200 mL/hr over 30 Minutes Intravenous Every 6 hours 01/15/22 1547 01/16/22 0101   01/15/22 1003  ceFAZolin (ANCEF) 2-4 GM/100ML-% IVPB       Note to Pharmacy: Desma Paganini S: cabinet override      01/15/22 1003 01/15/22 1130   01/15/22 0600  ceFAZolin (ANCEF) IVPB 2g/100 mL premix        2 g 200 mL/hr over 30 Minutes Intravenous On call to O.R. 01/15/22 0023 01/15/22 1120     .  Recent vital signs:  Vitals:   01/16/22 0920 01/16/22 1141  BP: (!) 133/51 (!) 122/59  Pulse: 63 (!) 58  Resp: 16 18  Temp: 97.6 F (36.4 C) 97.6 F (36.4  C)  SpO2: 98% 97%    Recent laboratory studies:  No results for input(s): "WBC", "HGB", "HCT", "PLT", "K", "CL", "CO2", "BUN", "CREATININE", "GLUCOSE", "CALCIUM", "LABPT", "INR" in the last 72 hours.  Diagnostic Studies: DG Knee Right Port  Result Date: 01/15/2022 CLINICAL DATA:  Status post right total knee replacement. EXAM: PORTABLE RIGHT KNEE - 1-2 VIEW COMPARISON:  June 17, 2021. FINDINGS: Right femoral and tibial components are well situated. Expected postoperative changes including surgical drain are seen in the soft tissues anteriorly. IMPRESSION: Status post right total knee arthroplasty. Electronically Signed   By: Lupita Raider M.D.   On: 01/15/2022 14:43    Discharge Medications:   Allergies as of 01/16/2022   No Known Allergies      Medication List     STOP taking these medications    aspirin EC 81 MG tablet       TAKE these medications    CALCIUM 600 + D PO Take 1 Dose by mouth daily.   carvedilol 12.5 MG tablet Commonly known as: COREG Take 12.5 mg by mouth 2 (two) times daily with a meal.   celecoxib 200 MG capsule Commonly known as: CELEBREX Take 1 capsule (200 mg total) by mouth 2 (two) times daily.   diclofenac Sodium 1 % Gel Commonly known as: VOLTAREN Apply topically 4 (four) times daily.   enoxaparin 40 MG/0.4ML injection Commonly known as: LOVENOX Inject 0.4 mLs (40 mg total) into the skin daily for 14 days.   hydrochlorothiazide 12.5 MG capsule Commonly known as: MICROZIDE Take 12.5 mg by mouth daily.   multivitamin-iron-minerals-folic acid Tabs tablet Take 1 tablet by mouth daily.   OMEGA-3 FATTY ACIDS PO Take 1 Dose by mouth daily.   oxyCODONE 5 MG immediate release tablet Commonly known as: Oxy IR/ROXICODONE Take 1 tablet (5 mg total) by mouth every 4 (four) hours as needed for severe pain.   rosuvastatin 20 MG tablet Commonly known as: CRESTOR Take 20 mg by mouth daily.   telmisartan-hydrochlorothiazide 80-25 MG  tablet Commonly known as: MICARDIS HCT Take 1 tablet by mouth daily.   traMADol 50 MG tablet Commonly known as: ULTRAM Take 1 tablet (50 mg total) by mouth every 4 (four) hours as needed for moderate pain.   vitamin C 500 MG tablet Commonly known as: ASCORBIC ACID Take 500 mg by mouth daily.   Vitamin D3 50 MCG (2000 UT) Tabs Take 1 Dose by mouth daily.               Durable Medical Equipment  (From admission, onward)           Start     Ordered   01/15/22 2123  DME Walker rolling  Once       Question:  Patient needs a walker to treat with the following condition  Answer:  Total knee replacement status   01/15/22 2123   01/15/22 2123  DME Bedside commode  Once       Question:  Patient needs a bedside commode to treat with the following condition  Answer:  Total knee replacement status   01/15/22 2123            Disposition: Home with home health PT     Follow-up Information     Artis Flock,  Lynnda Shields, PA Follow up on 01/29/2022.   Specialty: Physician Assistant Why: at 10:15am Contact information: [address] [place] [address] [phone]         Donato Heinz, MD Follow up on 03/04/2022.   Specialty: Orthopedic Surgery Why: at 2:30pm Contact information: [address] [facility] [place] [address] [phone]                  Lasandra Beech, PA-C 01/16/2022, 2:44 PM

## 2022-01-16 NOTE — Evaluation (Signed)
Physical Therapy Evaluation Patient Details Name: Cassandra Ball MRN: [id number] DOB: March 27, 1941 Today's Date: 01/16/2022  History of Present Illness  Pt is an 81 y.o. female s/p R TKA secondary degenerative arthrosis.  PMH includes htn, impaired glucose tolerance, diverticulosis 2016, HLD.  Clinical Impression  Prior to surgery, pt was ambulatory (used rollator vs SPC depending on activity/how she was feeling); lives alone at [facility] [place].  Currently pt is SBA semi-supine to sitting edge of bed; CGA with transfers using RW; and CGA ambulating 40 feet with RW use.  Minimal pain reported R knee during session (1/10 at rest).  Able to perform R LE SLR independently so no KI utilized.  R knee AROM 10-95 degrees.  Pt would benefit from skilled PT to address noted impairments and functional limitations (see below for any additional details).  Upon hospital discharge, pt would benefit from continued therapy.    Recommendations for follow up therapy are one component of a multi-disciplinary discharge planning process, led by the attending physician.  Recommendations may be updated based on patient status, additional functional criteria and insurance authorization.  Follow Up Recommendations Follow physician's recommendations for discharge plan and follow up therapies    Assistance Recommended at Discharge PRN  Patient can return home with the following  A little help with walking and/or transfers;A little help with bathing/dressing/bathroom;Assistance with cooking/housework;Assist for transportation;Help with stairs or ramp for entrance    Equipment Recommendations Rolling walker (2 wheels);BSC/3in1  Recommendations for Other Services  OT consult    Functional Status Assessment       Precautions / Restrictions Precautions Precautions: Knee Precaution Booklet Issued: Yes (comment) Restrictions Weight Bearing Restrictions: Yes RLE Weight Bearing: Weight bearing as tolerated       Mobility  Bed Mobility Overal bed mobility: Needs Assistance Bed Mobility: Supine to Sit     Supine to sit: Supervision, HOB elevated     General bed mobility comments: mild increased effort to perform on own    Transfers Overall transfer level: Needs assistance Equipment used: Rolling walker (2 wheels) Transfers: Sit to/from Stand, Bed to chair/wheelchair/BSC Sit to Stand: Min guard   Step pivot transfers: Min guard       General transfer comment: vc's for UE/LE placement for transfers; CGA to stand from bed and BSC; stand step turn bed to [facility] with RW use    Ambulation/Gait Ambulation/Gait assistance: Min guard Gait Distance (Feet): 40 Feet Assistive device: Rolling walker (2 wheels) Gait Pattern/deviations: Decreased stance time - right Gait velocity: decreased     General Gait Details: antalgic; mildly unsteady (CGA provided for safety); step to progressing to partial step through gait pattern; vc's for gait technique/pattern and walker use  Stairs            Wheelchair Mobility    Modified Rankin (Stroke Patients Only)       Balance Overall balance assessment: Needs assistance Sitting-balance support: No upper extremity supported, Feet supported Sitting balance-Leahy Scale: Good Sitting balance - Comments: steady sitting reaching within BOS   Standing balance support: Single extremity supported Standing balance-Leahy Scale: Good Standing balance comment: steady standing with at least single UE support                             Pertinent Vitals/Pain Pain Assessment Pain Assessment: 0-10 Pain Score: 1  Pain Location: R knee Pain Descriptors / Indicators: Aching Pain Intervention(s): Limited activity within patient's tolerance, Monitored during session,  Premedicated before session, Repositioned, Other (comment) (nurse to apply polar care when finished with nursing care) Vitals (HR and O2 on room air) stable and WFL throughout  treatment session.    Home Living Family/patient expects to be discharged to:: Skilled nursing facility                 Home Equipment: Rollator (4 wheels);Gilmer Mor - single point Additional Comments: [facility] [employer]; has w/in shower (plans to have g.b. put in), tall toilet, daughter plans to bring pt a shower chair    Prior Function Prior Level of Function : Independent/Modified Independent             Mobility Comments: Modified independent ambulating with SPC vs rollator depending on how she is feeling and what she is doing.  No recent falls.       Hand Dominance        Extremity/Trunk Assessment   Upper Extremity Assessment Upper Extremity Assessment: Overall WFL for tasks assessed    Lower Extremity Assessment Lower Extremity Assessment: RLE deficits/detail (L LE WFL) RLE Deficits / Details: at least 3/5 AROM hip flexion and ankle DF/PF; able to perform R LE SLR independently RLE: Unable to fully assess due to pain    Cervical / Trunk Assessment Cervical / Trunk Assessment: Normal  Communication   Communication: No difficulties  Cognition Arousal/Alertness: Awake/alert Behavior During Therapy: WFL for tasks assessed/performed Overall Cognitive Status: Within Functional Limits for tasks assessed                                          General Comments  Pt agreeable to PT session.    Exercises Total Joint Exercises Ankle Circles/Pumps: AROM, Strengthening, Both, 10 reps, Supine Quad Sets: AROM, Strengthening, Both, 10 reps, Supine Short Arc Quad: AROM, Strengthening, Right, 10 reps, Supine Heel Slides: AAROM, Strengthening, Right, 10 reps, Supine Hip ABduction/ADduction: AROM, Strengthening, Right, 10 reps, Supine Straight Leg Raises: AROM, Strengthening, Right, 10 reps, Supine Goniometric ROM: R knee AROM 10-95 degrees   Assessment/Plan    PT Assessment Patient needs continued PT services  PT Problem List Decreased  strength;Decreased range of motion;Decreased activity tolerance;Decreased balance;Decreased mobility;Decreased knowledge of use of DME;Decreased knowledge of precautions;Pain;Decreased skin integrity       PT Treatment Interventions DME instruction;Gait training;Functional mobility training;Therapeutic activities;Therapeutic exercise;Balance training;Patient/family education;Stair training    PT Goals (Current goals can be found in the Care Plan section)  Acute Rehab PT Goals Patient Stated Goal: to go to rehab facility upon hospital discharge; improve mobility PT Goal Formulation: With patient Time For Goal Achievement: 01/30/22 Potential to Achieve Goals: Good    Frequency BID     Co-evaluation               AM-PAC PT "6 Clicks" Mobility  Outcome Measure Help needed turning from your back to your side while in a flat bed without using bedrails?: None Help needed moving from lying on your back to sitting on the side of a flat bed without using bedrails?: A Little Help needed moving to and from a bed to a chair (including a wheelchair)?: A Little Help needed standing up from a chair using your arms (e.g., wheelchair or bedside chair)?: A Little Help needed to walk in hospital room?: A Little Help needed climbing 3-5 steps with a railing? : A Little 6 Click Score: 19  End of Session Equipment Utilized During Treatment: Gait belt Activity Tolerance: Patient tolerated treatment well Patient left: in chair;with nursing/sitter in room;Other (comment) (B heels floating via towel rolls; nurse reported she would set pt up (polar care, call light, phone, and SCD's) when finished with nursing care) Nurse Communication: Mobility status;Precautions;Weight bearing status PT Visit Diagnosis: Other abnormalities of gait and mobility (R26.89);Muscle weakness (generalized) (M62.81);Pain Pain - Right/Left: Right Pain - part of body: Knee    Time: [id number] PT Time Calculation (min)  (ACUTE ONLY): 34 min   Charges:   PT Evaluation $PT Eval Low Complexity: 1 Low PT Treatments $Therapeutic Exercise: 8-22 mins       Leitha Bleak, PT 01/16/22, 11:55 AM

## 2022-01-16 NOTE — TOC Progression Note (Signed)
Transition of Care [facility]) - Progression Note    Patient Details  Name: Cassandra Ball MRN: [id number] Date of Birth: Oct 14, 1940  Transition of [facility]) CM/SW Contact  Marlowe Sax, RN Phone Number: 01/16/2022, 2:27 PM  Clinical Narrative:    Patient stated that she is going to go home with home health [facility] is set up  RW and 3 in 1 to be delivered to the bedside by [employer]      Expected Discharge Plan and Services                                                 Social Determinants of Health (SDOH) Interventions    Readmission Risk Interventions     No data to display

## 2022-01-16 NOTE — Progress Notes (Signed)
Physical Therapy Treatment Patient Details Name: Cassandra Ball MRN: [id number] DOB: November 22, 1940 Today's Date: 01/16/2022   History of Present Illness Pt is an 81 y.o. female s/p R TKA secondary degenerative arthrosis.  PMH includes htn, impaired glucose tolerance, diverticulosis 2016, HLD.    PT Comments    Pt resting in recliner upon PT arrival; pt's son present; pt agreeable to PT session.  During session pt CGA progressing to SBA with transfers and ambulation 200 feet with RW; also CGA navigating 4 steps with B railings.  Pt assisted back to bed end of session to rest.  4-5/10 R knee pain during sessions activities and at rest beginning/end of session.  Reviewed safe car transfer technique, current assist levels, and home safety: pt and pt's son verbalizing appropriate understanding.  PA updated on pt's mobility status.  Will continue to focus on strengthening, ROM, and progressive functional mobility during hospitalization.    Recommendations for follow up therapy are one component of a multi-disciplinary discharge planning process, led by the attending physician.  Recommendations may be updated based on patient status, additional functional criteria and insurance authorization.  Follow Up Recommendations  Follow physician's recommendations for discharge plan and follow up therapies     Assistance Recommended at Discharge PRN  Patient can return home with the following A little help with walking and/or transfers;A little help with bathing/dressing/bathroom;Assistance with cooking/housework;Assist for transportation;Help with stairs or ramp for entrance   Equipment Recommendations  Rolling walker (2 wheels);BSC/3in1    Recommendations for Other Services OT consult     Precautions / Restrictions Precautions Precautions: Knee Precaution Booklet Issued: Yes (comment) Restrictions Weight Bearing Restrictions: Yes RLE Weight Bearing: Weight bearing as tolerated     Mobility  Bed  Mobility Overal bed mobility: Modified Independent Bed Mobility: Sit to Supine     Sit to supine: Modified independent (Device/Increase time)   General bed mobility comments: mild increased effort to perform on own    Transfers Overall transfer level: Needs assistance Equipment used: Rolling walker (2 wheels) Transfers: Sit to/from Stand Sit to Stand: Supervision   Step pivot transfers: Min guard       General transfer comment: x1 trial from recliner and x1 trial from bed; steady with RW use    Ambulation/Gait Ambulation/Gait assistance: Min guard, Supervision Gait Distance (Feet): 200 Feet Assistive device: Rolling walker (2 wheels) Gait Pattern/deviations: Decreased stance time - right Gait velocity: decreased     General Gait Details: antalgic; partial step through to step through gait pattern; steady with RW use   Stairs Stairs: Yes Stairs assistance: Min guard Stair Management: Two rails, Step to pattern, Forwards Number of Stairs: 4 General stair comments: initial vc's for LE sequencing; steady with B UE support on railings   Wheelchair Mobility    Modified Rankin (Stroke Patients Only)       Balance Overall balance assessment: Needs assistance Sitting-balance support: No upper extremity supported, Feet supported Sitting balance-Leahy Scale: Normal Sitting balance - Comments: steady sitting reaching outside BOS   Standing balance support: Bilateral upper extremity supported, During functional activity, Reliant on assistive device for balance Standing balance-Leahy Scale: Good Standing balance comment: steady ambulating with RW use                            Cognition Arousal/Alertness: Awake/alert Behavior During Therapy: WFL for tasks assessed/performed Overall Cognitive Status: Within Functional Limits for tasks assessed  Exercises Total Joint Exercises Long Arc Quad: AROM,  Strengthening, Right, 10 reps, Seated Knee Flexion: AROM, Strengthening, Right, 10 reps, Seated (washcloth under R foot) Goniometric ROM: R knee AROM 10-95 degrees (taken in morning session)    General Comments  Pt's son present during session.      Pertinent Vitals/Pain Pain Assessment Pain Assessment: 0-10 Pain Score: 4  Pain Location: R knee Pain Descriptors / Indicators: Aching, Sore Pain Intervention(s): Limited activity within patient's tolerance, Monitored during session, Premedicated before session, Repositioned, Other (comment) (polar care applied) Vitals (HR and O2 on room air) stable and WFL throughout treatment session.    Home Living Family/patient expects to be discharged to:: Skilled nursing facility                 Home Equipment: Rollator (4 wheels);Cane - single point Additional Comments: [facility] [employer]; has w/in shower (plans to have g.b. put in), tall toilet, daughter plans to bring pt a shower chair    Prior Function            PT Goals (current goals can now be found in the care plan section) Acute Rehab PT Goals Patient Stated Goal: improve mobility PT Goal Formulation: With patient Time For Goal Achievement: 01/30/22 Potential to Achieve Goals: Good Progress towards PT goals: Progressing toward goals    Frequency    BID      PT Plan Current plan remains appropriate    Co-evaluation              AM-PAC PT "6 Clicks" Mobility   Outcome Measure  Help needed turning from your back to your side while in a flat bed without using bedrails?: None Help needed moving from lying on your back to sitting on the side of a flat bed without using bedrails?: None Help needed moving to and from a bed to a chair (including a wheelchair)?: A Little Help needed standing up from a chair using your arms (e.g., wheelchair or bedside chair)?: A Little Help needed to walk in hospital room?: A Little Help needed climbing 3-5 steps with a railing?  : A Little 6 Click Score: 20    End of Session Equipment Utilized During Treatment: Gait belt Activity Tolerance: Patient tolerated treatment well Patient left: in bed;with call bell/phone within reach;with bed alarm set;with family/visitor present;with SCD's reapplied;Other (comment) (B heels floating via towel rolls) Nurse Communication: Mobility status;Precautions;Weight bearing status PT Visit Diagnosis: Other abnormalities of gait and mobility (R26.89);Muscle weakness (generalized) (M62.81);Pain Pain - Right/Left: Right Pain - part of body: Knee     Time: 1322-1400 PT Time Calculation (min) (ACUTE ONLY): 38 min  Charges:  $Gait Training: 8-22 mins $Therapeutic Exercise: 8-22 mins $Therapeutic Activity: 8-22 mins                     Hendricks Limes, PT 01/16/22, 2:15 PM

## 2022-01-16 NOTE — Progress Notes (Signed)
  Subjective: 1 Day Post-Op Procedure(s) (LRB): COMPUTER ASSISTED TOTAL KNEE ARTHROPLASTY (Right) Patient reports pain as mild.  Daughter at bedside.  Patient is well, and has had no acute complaints or problems Plan is to go Skilled nursing facility at [facility] after hospital stay. Negative for chest pain and shortness of breath Fever: no Gastrointestinal: negative for nausea and vomiting.  Patient has not had a bowel movement.  Objective: Vital signs in last 24 hours: Temp:  [97.4 F (36.3 C)-98.3 F (36.8 C)] 98.3 F (36.8 C) (06/15 0334) Pulse Rate:  [50-71] 61 (06/15 0334) Resp:  [11-22] 16 (06/15 0334) BP: (104-159)/(48-73) 104/50 (06/15 0334) SpO2:  [96 %-100 %] 98 % (06/15 0334)  Intake/Output from previous day:  Intake/Output Summary (Last 24 hours) at 01/16/2022 0907 Last data filed at 01/16/2022 0641 Gross per 24 hour  Intake 1362.35 ml  Output 571 ml  Net 791.35 ml    Intake/Output this shift: No intake/output data recorded.  Labs: No results for input(s): "HGB" in the last 72 hours. No results for input(s): "WBC", "RBC", "HCT", "PLT" in the last 72 hours. No results for input(s): "NA", "K", "CL", "CO2", "BUN", "CREATININE", "GLUCOSE", "CALCIUM" in the last 72 hours. No results for input(s): "LABPT", "INR" in the last 72 hours.   EXAM General - Patient is Alert, Appropriate, and Oriented Extremity - Neurovascular intact Dorsiflexion/Plantar flexion intact Compartment soft Dressing/Incision -Postoperative dressing remains in place., Polar Care in place and working. , Hemovac in place. , Following removal of post-op dressing, no drainage noted on bandage. Motor Function - intact, moving foot and toes well on exam. Able to perform independent SLR.  Cardiovascular- Regular rate and rhythm, no murmurs/rubs/gallops Respiratory- Lungs clear to auscultation bilaterally Gastrointestinal- soft, nontender, and active bowel sounds   Assessment/Plan: 1  Day Post-Op Procedure(s) (LRB): COMPUTER ASSISTED TOTAL KNEE ARTHROPLASTY (Right) Principal Problem:   Total knee replacement status  Estimated body mass index is 27.85 kg/m as calculated from the following:   Height as of 01/03/22: 5\' 3"  (1.6 m).   Weight as of 01/03/22: 71.3 kg. Advance diet Up with therapy  Possible d/c this PM with completion of PT goals.   Post-op dressing removed. , Hemovac removed., and Mini compression dressing applied.   DVT Prophylaxis - Eliquis, Ted hose, and SCDs Weight-Bearing as tolerated to right leg  03/05/22, PA-C [facility] Orthopaedic Surgery 01/16/2022, 9:07 AM

## 2022-01-16 NOTE — TOC Progression Note (Signed)
Transition of Care [facility]) - Progression Note    Patient Details  Name: Cassandra Ball MRN: [id number] Date of Birth: [birth year]  Transition of Care [facility]) CM/SW Contact  Marlowe Sax, RN Phone Number: 01/16/2022, 12:58 PM  Clinical Narrative:     Spoke to the patient and her son, they found out that he will be able to stay with her longer at home if Ins does not approve to go to [facility] SNF, ins is pending [facility]       Expected Discharge Plan and Services                                                 Social Determinants of Health (SDOH) Interventions    Readmission Risk Interventions     No data to display

## 2022-01-16 NOTE — Progress Notes (Cosign Needed)
Patient is not able to walk the distance required to go the bathroom, or he/she is unable to safely negotiate stairs required to access the bathroom.  A 3in1 BSC will alleviate this problem  

## 2022-01-16 NOTE — TOC Progression Note (Signed)
Transition of Care [facility]) - Progression Note    Patient Details  Name: Cassandra Ball MRN: [id number] Date of Birth: Dec 30, 1940  Transition of Care [facility]) CM/SW Contact  Marlowe Sax, RN Phone Number: 01/16/2022, 11:25 AM  Clinical Narrative:     The patient plans to go to [facility] for STR SNF She understands that Ins will have to approve, she will pay out of pocket if needed       Expected Discharge Plan and Services                                                 Social Determinants of Health (SDOH) Interventions    Readmission Risk Interventions     No data to display

## 2022-01-16 NOTE — Progress Notes (Signed)
Patient discharged to home. AVS reviewed including new medications and follow up appointments. Printed prescriptions provided. The patient's daughter is an Charity fundraiser and plans to administer the patient's lovenox for her. DME delivered to the room. Her son provided transportation

## 2022-01-16 NOTE — Evaluation (Signed)
Occupational Therapy Evaluation Patient Details Name: Cassandra Ball MRN: [id number] DOB: 1941/07/25 Today's Date: 01/16/2022   History of Present Illness Pt is an 81 y.o. female s/p R TKA secondary degenerative arthrosis.  PMH includes htn, impaired glucose tolerance, diverticulosis 2016, HLD.   Clinical Impression   Cassandra Ball was seen for OT evaluation this date. Prior to hospital admission, pt was MOD I for mobility and ADLs. Pt lives at [facility]. Pt presents to acute OT demonstrating impaired ADL performance and functional mobility 2/2 decreased activity tolerance and functional strength/ROM/balance deficits. Pt currently requires  MOD I don/doff socks in sitting. SBA + RW for ADL t/f and standing grooming tasks. Pt instructed in polar care mgt, falls prevention strategies, home/routines modifications, DME/AE for LB bathing/dressing tasks, and compression stocking mgt. Will sign off, education complete. Upon hospital discharge, recommend HHOT to maximize pt safety and return to PLOF.   Recommendations for follow up therapy are one component of a multi-disciplinary discharge planning process, led by the attending physician.  Recommendations may be updated based on patient status, additional functional criteria and insurance authorization.   Follow Up Recommendations  No OT follow up    Assistance Recommended at Discharge Intermittent Supervision/Assistance  Patient can return home with the following A little help with walking and/or transfers;A little help with bathing/dressing/bathroom    Functional Status Assessment  Patient has had a recent decline in their functional status and demonstrates the ability to make significant improvements in function in a reasonable and predictable amount of time.  Equipment Recommendations  BSC/3in1    Recommendations for Other Services       Precautions / Restrictions Precautions Precautions: Knee Precaution Booklet Issued: Yes  (comment) Restrictions Weight Bearing Restrictions: Yes RLE Weight Bearing: Weight bearing as tolerated      Mobility Bed Mobility               General bed mobility comments: recieve dand left sitting    Transfers Overall transfer level: Needs assistance Equipment used: Rolling walker (2 wheels) Transfers: Sit to/from Stand Sit to Stand: Supervision                  Balance Overall balance assessment: Needs assistance Sitting-balance support: No upper extremity supported, Feet supported Sitting balance-Leahy Scale: Normal     Standing balance support: Single extremity supported, During functional activity Standing balance-Leahy Scale: Good                             ADL either performed or assessed with clinical judgement   ADL Overall ADL's : Needs assistance/impaired                                       General ADL Comments: MOD I don/doff socks in sitting. SBA + RW for ADL t/f and standing grooming tasks      Pertinent Vitals/Pain Pain Assessment Pain Assessment: 0-10 Pain Score: 3  Pain Location: R knee Pain Descriptors / Indicators: Aching, Sore Pain Intervention(s): Limited activity within patient's tolerance, Repositioned     Hand Dominance     Extremity/Trunk Assessment Upper Extremity Assessment Upper Extremity Assessment: Overall WFL for tasks assessed   Lower Extremity Assessment Lower Extremity Assessment: Generalized weakness RLE Deficits / Details: at least 3/5 AROM hip flexion and ankle DF/PF; able to perform R LE SLR independently  RLE: Unable to fully assess due to pain   Cervical / Trunk Assessment Cervical / Trunk Assessment: Normal   Communication Communication Communication: No difficulties   Cognition Arousal/Alertness: Awake/alert Behavior During Therapy: WFL for tasks assessed/performed Overall Cognitive Status: Within Functional Limits for tasks assessed                                                   Home Living Family/patient expects to be discharged to:: Assisted living                             Home Equipment: Rollator (4 wheels);Gil[place] Mor - single point   Additional Comments: [facility]; has w/in shower (plans to have g.b. put in), tall toilet, daughter plans to bring pt a shower chair      Prior Functioning/Environment Prior Level of Function : Independent/Modified Independent             Mobility Comments: Modified independent ambulating with SPC vs rollator depending on how she is feeling and what she is doing.  No recent falls.          OT Problem List: Decreased strength;Decreased range of motion;Decreased activity tolerance;Impaired balance (sitting and/or standing)      OT Treatment/Interventions:      OT Goals(Current goals can be found in the care plan section) Acute Rehab OT Goals Patient Stated Goal: to go home OT Goal Formulation: With patient Time For Goal Achievement: 01/30/22 Potential to Achieve Goals: Good  OT Frequency:      Co-evaluation              AM-PAC OT "6 Clicks" Daily Activity     Outcome Measure Help from another person eating meals?: None Help from another person taking care of personal grooming?: None Help from another person toileting, which includes using toliet, bedpan, or urinal?: A Little Help from another person bathing (including washing, rinsing, drying)?: A Little Help from another person to put on and taking off regular upper body clothing?: None Help from another person to put on and taking off regular lower body clothing?: None 6 Click Score: 22   End of Session Equipment Utilized During Treatment: Rolling walker (2 wheels)  Activity Tolerance: Patient tolerated treatment well Patient left: in chair;with call bell/phone within reach;with chair alarm set  OT Visit Diagnosis: Unsteadiness on feet (R26.81)                Time: 7517-0017 OT Time  Calculation (min): 18 min Charges:  OT General Charges $OT Visit: 1 Visit OT Evaluation $OT Eval Low Complexity: 1 Low  Kathie Dike, M.S. OTR/L  01/16/22, 3:05 PM  ascom [phone]

## 2022-01-16 NOTE — Plan of Care (Signed)

## 2022-03-12 ENCOUNTER — Other Ambulatory Visit: Payer: Self-pay

## 2022-03-12 ENCOUNTER — Encounter
Admission: RE | Admit: 2022-03-12 | Discharge: 2022-03-12 | Disposition: A | Discharge status: Home / Self Care | Payer: Medicare HMO | Source: Ambulatory Visit | Attending: Orthopedic Surgery | Admitting: Orthopedic Surgery

## 2022-03-12 VITALS — BP 141/61 | HR 65 | Resp 16 | Ht 62.0 in | Wt 154.5 lb

## 2022-03-12 DIAGNOSIS — R7302 Impaired glucose tolerance (oral): Secondary | ICD-10-CM | POA: Insufficient documentation

## 2022-03-12 DIAGNOSIS — E782 Mixed hyperlipidemia: Secondary | ICD-10-CM | POA: Insufficient documentation

## 2022-03-12 DIAGNOSIS — M1712 Unilateral primary osteoarthritis, left knee: Secondary | ICD-10-CM | POA: Diagnosis not present

## 2022-03-12 DIAGNOSIS — Z01812 Encounter for preprocedural laboratory examination: Secondary | ICD-10-CM | POA: Insufficient documentation

## 2022-03-12 HISTORY — DX: Personal history of urinary calculi: Z87.442

## 2022-03-12 LAB — COMPREHENSIVE METABOLIC PANEL
ALT: 17 U/L (ref 0–44)
AST: 23 U/L (ref 15–41)
Albumin: 3.8 g/dL (ref 3.5–5.0)
Alkaline Phosphatase: 38 U/L (ref 38–126)
Anion gap: 7 (ref 5–15)
BUN: 19 mg/dL (ref 8–23)
CO2: 30 mmol/L (ref 22–32)
Calcium: 9.5 mg/dL (ref 8.9–10.3)
Chloride: 99 mmol/L (ref 98–111)
Creatinine, Ser: 0.64 mg/dL (ref 0.44–1.00)
GFR, Estimated: >60 mL/min (ref >60)
Glucose, Bld: 97 mg/dL (ref 70–99)
Potassium: 3.5 mmol/L (ref 3.5–5.1)
Sodium: 136 mmol/L (ref 135–145)
Total Bilirubin: 0.6 mg/dL (ref 0.3–1.2)
Total Protein: 7.7 g/dL (ref 6.5–8.1)

## 2022-03-12 LAB — CBC
HCT: 34.8 % — ABNORMAL LOW (ref 36.0–46.0)
Hemoglobin: 11.2 g/dL — ABNORMAL LOW (ref 12.0–15.0)
MCH: 30.4 pg (ref 26.0–34.0)
MCHC: 32.2 g/dL (ref 30.0–36.0)
MCV: 94.3 fL (ref 80.0–100.0)
Platelets: 280 10*3/uL (ref 150–400)
RBC: 3.69 MIL/uL — ABNORMAL LOW (ref 3.87–5.11)
RDW: 13.4 % (ref 11.5–15.5)
WBC: 6.7 10*3/uL (ref 4.0–10.5)
nRBC: 0 % (ref 0.0–0.2)

## 2022-03-12 LAB — URINALYSIS, ROUTINE W REFLEX MICROSCOPIC
Bacteria, UA: NONE SEEN
Bilirubin Urine: NEGATIVE
Glucose, UA: NEGATIVE mg/dL
Hgb urine dipstick: NEGATIVE
Ketones, ur: NEGATIVE mg/dL
Nitrite: NEGATIVE
Protein, ur: NEGATIVE mg/dL
Specific Gravity, Urine: 1.016 (ref 1.005–1.030)
pH: 7 (ref 5.0–8.0)

## 2022-03-12 LAB — C-REACTIVE PROTEIN: CRP: 0.7 mg/dL (ref <1.0)

## 2022-03-12 LAB — SURGICAL PCR SCREEN
MRSA, PCR: NEGATIVE
Staphylococcus aureus: NEGATIVE

## 2022-03-12 LAB — TYPE AND SCREEN
ABO/RH(D): A POS
Antibody Screen: NEGATIVE

## 2022-03-12 LAB — SEDIMENTATION RATE: Sed Rate: 45 mm/hr — ABNORMAL HIGH (ref 0–30)

## 2022-03-12 NOTE — Patient Instructions (Addendum)
Your procedure is scheduled on: 03/24/22 - Monday Report to the Registration Desk on the 1st floor of the [facility]. To find out your arrival time, please call [phone] between 1PM - 3PM on: 03/21/22 - Friday If your arrival time is 6:00 am, do not arrive prior to that time as the [facility] entrance doors do not open until 6:00 am.  REMEMBER: Instructions that are not followed completely may result in serious medical risk, up to and including death; or upon the discretion of your surgeon and anesthesiologist your surgery may need to be rescheduled.  Do not eat food after midnight the night before surgery.  No gum chewing, lozengers or hard candies.  You may however, drink CLEAR liquids up to 2 hours before you are scheduled to arrive for your surgery. Do not drink anything within 2 hours of your scheduled arrival time.  Clear liquids include: - water  - apple juice without pulp - gatorade (not RED colors) - black coffee or tea (Do NOT add milk or creamers to the coffee or tea) Do NOT drink anything that is not on this list.  In addition, your doctor has ordered for you to drink the provided  Ensure Pre-Surgery Clear Carbohydrate Drink  Drinking this carbohydrate drink up to two hours before surgery helps to reduce insulin resistance and improve patient outcomes. Please complete drinking 2 hours prior to scheduled arrival time.  TAKE THESE MEDICATIONS THE MORNING OF SURGERY WITH A SIP OF WATER:  - carvedilol (COREG) 12.5 MG tablet  One week prior to surgery: Stop Anti-inflammatories (NSAIDS) such as Advil, Aleve, Ibuprofen, Motrin, Naproxen, Naprosyn and Aspirin based products such as Excedrin, Goodys Powder, BC Powder.  Stop ANY OVER THE COUNTER supplements beginning 03/17/22 until after surgery : OMEGA-3 , multivitamin, VITAMIN D3,  CALCIUM 600 + D.  You may however, continue to take Tylenol if needed for pain up until the day of surgery.  No Alcohol for 24 hours  before or after surgery.  No Smoking including e-cigarettes for 24 hours prior to surgery.  No chewable tobacco products for at least 6 hours prior to surgery.  No nicotine patches on the day of surgery.  Do not use any "recreational" drugs for at least a week prior to your surgery.  Please be advised that the combination of cocaine and anesthesia may have negative outcomes, up to and including death. If you test positive for cocaine, your surgery will be cancelled.  On the morning of surgery brush your teeth with toothpaste and water, you may rinse your mouth with mouthwash if you wish. Do not swallow any toothpaste or mouthwash.  Use CHG Soap or wipes as directed on instruction sheet.  Do not wear jewelry, make-up, hairpins, clips or nail polish.  Do not wear lotions, powders, or perfumes.   Do not shave body from the neck down 48 hours prior to surgery just in case you cut yourself which could leave a site for infection.  Also, freshly shaved skin may become irritated if using the CHG soap.  Contact lenses, hearing aids and dentures may not be worn into surgery.  Do not bring valuables to the hospital. [facility] is not responsible for any missing/lost belongings or valuables.   Notify your doctor if there is any change in your medical condition (cold, fever, infection).  Wear comfortable clothing (specific to your surgery type) to the hospital.  After surgery, you can help prevent lung complications by doing breathing exercises.  Take  deep breaths and cough every 1-2 hours. Your doctor may order a device called an Incentive Spirometer to help you take deep breaths. When coughing or sneezing, hold a pillow firmly against your incision with both hands. This is called "splinting." Doing this helps protect your incision. It also decreases belly discomfort.  If you are being admitted to the hospital overnight, leave your suitcase in the car. After surgery it may be brought to your  room.  If you are being discharged the day of surgery, you will not be allowed to drive home. You will need a responsible adult (18 years or older) to drive you home and stay with you that night.   If you are taking public transportation, you will need to have a responsible adult (18 years or older) with you. Please confirm with your physician that it is acceptable to use public transportation.   Please call the Pre-admissions Testing Dept. at [phone] if you have any questions about these instructions.  Surgery Visitation Policy:  Patients undergoing a surgery or procedure may have two family members or support persons with them as long as the person is not COVID-19 positive or experiencing its symptoms.   Inpatient Visitation:    Visiting hours are 7 a.m. to 8 p.m. Up to four visitors are allowed at one time in a patient room, including children. The visitors may rotate out with other people during the day. One designated support person (adult) may remain overnight.

## 2022-03-16 NOTE — Discharge Instructions (Signed)
Instructions after Total Knee Replacement   Cassandra Ball Cassandra Ball, Jr., M.D.     Dept. of Orthopaedics & Sports Medicine  [facility]  [address]  [place], [place]  [address]  Phone: [phone]   Fax: [phone]    DIET: Drink plenty of non-alcoholic fluids. Resume your normal diet. Include foods high in fiber.  ACTIVITY:  You may use crutches or a walker with weight-bearing as tolerated, unless instructed otherwise. You may be weaned off of the walker or crutches by your Physical Therapist.  Do NOT place pillows under the knee. Anything placed under the knee could limit your ability to straighten the knee.   Continue doing gentle exercises. Exercising will reduce the pain and swelling, increase motion, and prevent muscle weakness.   Please continue to use the TED compression stockings for 6 weeks. You may remove the stockings at night, but should reapply them in the morning. Do not drive or operate any equipment until instructed.  WOUND CARE:  Continue to use the PolarCare or ice packs periodically to reduce pain and swelling. You may bathe or shower after the staples are removed at the first office visit following surgery.  MEDICATIONS: You may resume your regular medications. Please take the pain medication as prescribed on the medication. Do not take pain medication on an empty stomach. You have been given a prescription for a blood thinner (Lovenox or Coumadin). Please take the medication as instructed. (NOTE: After completing a 2 week course of Lovenox, take one Enteric-coated aspirin once a day. This along with elevation will help reduce the possibility of phlebitis in your operated leg.) Do not drive or drink alcoholic beverages when taking pain medications.  CALL THE OFFICE FOR: Temperature above 101 degrees Excessive bleeding or drainage on the dressing. Excessive swelling, coldness, or paleness of the toes. Persistent nausea and vomiting.  FOLLOW-UP:  You  should have an appointment to return to the office in 10-14 days after surgery. Arrangements have been made for continuation of Physical Therapy (either home therapy or outpatient therapy).   [facility] Department Directory         [email or web address]       [email or web address]          Cardiology  Appointments: [place] - [phone] [place] - [phone]  Endocrinology  Appointments: [place] - [phone] [place] - [phone]  Gastroenterology  Appointments: [place] - [phone] [place] - [phone]        General Surgery   Appointments: [place] - [phone]  Internal Medicine/Family Medicine  Appointments: [place] - [phone] [place] - [phone] [place] - [phone]  Metabolic and Weigh Loss Surgery  Appointments: [place] - [phone]        Neurology  Appointments: [place] - [phone] [place] - [phone]  Neurosurgery  Appointments: [place] - [phone]  Obstetrics & Gynecology  Appointments: [place] - [phone] [place] - [phone]        Pediatrics  Appointments: [place] - [phone] [place] - [phone]  Physiatry  Appointments: [place] -[phone]  Physical Therapy  Appointments: [place] - [phone] [place] - [phone]        Podiatry  Appointments: [place] - [phone] [place] - [phone]  Pulmonology  Appointments: [place] - [phone]  Rheumatology  Appointments:  - [phone]         Location: [facility]  [address] , [place]  [address]  [place] Location: [facility] [address] [place], [place]  [address]  [place] Location: [facility] [address] [place],   [address]    

## 2022-03-23 MED ORDER — ORAL CARE MOUTH RINSE
15.0000 mL | Freq: Once | OROMUCOSAL | Status: AC
  Administered 2022-03-24: 15 mL via OROMUCOSAL

## 2022-03-23 MED ORDER — GABAPENTIN 300 MG PO CAPS
300.0000 mg | ORAL_CAPSULE | Freq: Once | ORAL | Status: DC
  Administered 2022-03-24: 300 mg via ORAL

## 2022-03-23 MED ORDER — FAMOTIDINE 20 MG PO TABS: 20.0000 mg | ORAL_TABLET | Freq: Once | ORAL | Status: DC

## 2022-03-23 MED ORDER — CEFAZOLIN SODIUM-DEXTROSE 2-4 GM/100ML-% IV SOLN: 2.0000 g | INTRAVENOUS | Status: DC

## 2022-03-23 MED ORDER — CELECOXIB 200 MG PO CAPS: 400.0000 mg | ORAL_CAPSULE | Freq: Once | ORAL | Status: AC

## 2022-03-23 MED ORDER — CHLORHEXIDINE GLUCONATE 0.12 % MT SOLN: 15.0000 mL | Freq: Once | OROMUCOSAL | Status: AC

## 2022-03-23 MED ORDER — LACTATED RINGERS IV SOLN: INTRAVENOUS | Status: DC

## 2022-03-23 MED ORDER — DEXAMETHASONE SODIUM PHOSPHATE 10 MG/ML IJ SOLN: 8.0000 mg | Freq: Once | INTRAMUSCULAR | Status: DC

## 2022-03-23 MED ORDER — TRANEXAMIC ACID-NACL 1000-0.7 MG/100ML-% IV SOLN: 1000.0000 mg | INTRAVENOUS | Status: DC

## 2022-03-23 MED ORDER — CHLORHEXIDINE GLUCONATE 4 % EX LIQD: 60.0000 mL | Freq: Once | CUTANEOUS | Status: DC

## 2022-03-23 NOTE — H&P (Signed)
ORTHOPAEDIC HISTORY & PHYSICAL Michelene Gardener, [place] - 03/18/2022 1:30 PM EDT Formatting of this note is different from the original. [facility] Chief Complaint:   Chief Complaint  Patient presents with  Knee Pain  H & P LEFT KNEE   History of Present Illness:   Cassandra Ball is a 81 y.o. female that presents to clinic today for her preoperative history and evaluation. Patient presents unaccompanied. The patient is scheduled to undergo a left total knee arthroplasty on 03/24/22 by Dr. Ernest Pine. Her pain began several years ago. The pain is located primarily along the medial aspect of the knee. She describes her pain as worse with weightbearing. She reports associated swelling with some giving way of the knee. She denies associated numbness or tingling, denies locking.   The patient's symptoms have progressed to the point that they decrease her quality of life. The patient has previously undergone conservative treatment including NSAIDS and injections to the knee without adequate control of her symptoms.  Denies any history of lumbar surgery, DVT, or significant cardiac history. Her daughter and son will be available to stay with her after surgery.   Patient has been cleared by hematology/oncology.  Past Medical, Surgical, Family, Social History, Allergies, Medications:   Past Medical History:  Past Medical History:  Diagnosis Date  Arthritis  Diverticulosis 07/05/2015  Glucose intolerance (impaired glucose tolerance) 06/19/2016  Hyperlipidemia  Hypertension   Past Surgical History:  Past Surgical History:  Procedure Laterality Date  COLONOSCOPY 07/05/2015  Diverticulosis/Otherwise normal colon/No Repeat/PYO  Right total knee arthroplasty using computer-assisted navigation 01/15/2022  Dr Ernest Pine  TONSILLECTOMY   Current Medications:  Current Outpatient Medications  Medication Sig Dispense Refill  acetaminophen (TYLENOL) 500 MG  tablet Take 500 mg by mouth 2 (two) times daily as needed  aspirin 81 MG EC tablet Take 1 tablet (81 mg total) by mouth once daily 30 tablet 11  calcium carbonate/vitamin D3 (CALTRATE 600 + D ORAL) Take 1 tablet by mouth once daily  carvediloL (COREG) 12.5 MG tablet Take 1 tablet (12.5 mg total) by mouth 2 (two) times daily with meals 180 tablet 3  cholecalciferol, vitamin D3, (VITAMIN D3) 125 mcg (5,000 unit) tablet Take 5,000 Units by mouth once daily  diclofenac (VOLTAREN) 1 % topical gel Apply 4 g topically 4 (four) times daily (Patient taking differently: Apply 4 g topically 2 (two) times daily as needed) 350 g 3  hydrocortisone 2.5 % cream Apply topically 2 (two) times daily 30 g 0  multivitamin (THERAGRAN) tablet 1 tab by mouth daily  omega-3 fatty acids-fish oil 300-1,000 mg capsule Take 1 capsule by mouth once daily  rosuvastatin (CRESTOR) 20 MG tablet Take 1 tablet (20 mg total) by mouth at bedtime 90 tablet 3  telmisartan-hydroCHLOROthiazide (MICARDIS HCT) 80-25 mg tablet Take 1 tablet by mouth once daily 90 tablet 3  acetaminophen (TYLENOL) 650 MG ER tablet Take 650 mg by mouth 2 (two) times daily as needed for Pain  cholecalciferol (VITAMIN D3) 2,000 unit tablet Take 1 tablet (2,000 Units total) by mouth once daily 30 tablet 11   No current facility-administered medications for this visit.   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  Tobacco Use  Smoking status: Former  Packs/day:  0.75  Years: 10.00  Pack years: 7.50  Types: Cigarettes  Quit date: 36  Years since quitting: 50.6  Smokeless tobacco: Never  Vaping Use  Vaping Use: Never used  Substance and Sexual Activity  Alcohol use: Yes  Alcohol/week: 5.0 standard drinks   Types: 5 Glasses of wine per week  Drug use: No  Comment: cigarettes --quit 1970  Sexual activity: Defer  Partners: Male  Birth control/protection: Post-menopausal  Other Topics Concern  Would you please tell us about the people who live in your home, your pets, or anything else important to your social life? Yes  Comment: Dog  Social History Narrative  Widowed in September 2012. G3 P3. Retired Runner, broadcasting/film/video. Enjoys golfing. Lives in independent living. Daughter here with 3 children in [place], son in [place], son in [place].   03/10/2022  Military Service: None  Likes/Enjoys/What fills your day?: Narrative  Home: One Estate agent is on: First Level  Fewest Steps to enter the home: 0  Other persons in the home: lives alone  Pets: Manufacturing engineer you use daily: Cane, Single point Systems analyst available in the home: Shower Chair/Bench  Dental: no significant dental problems. Last screening Date: August 2020. Screening is: Up to date, Provider Name - Dr. Malen Gauze  Vision: Wears reading glasses. Last screening Date: August 2022. Screening is: Up to date, Provider Name - Dr. Brooke Dare  Hearing: Denies any issues in Hearing .  Dermatology: Denies areas of concern. Last screening Date: February 2023 Screening is: Up to date, Provider Name - [facility]    Social Determinants of Health   Financial Resource Strain: Low Risk  Difficulty of Paying Living Expenses: Not hard at all  Food Insecurity: No Food Insecurity  Worried About Programme researcher, broadcasting/film/video in the Last Year: Never true  Barista in the Last Year: Never true  Transportation Needs: No Personnel officer (Medical): No  Lack of Transportation (Non-Medical): No  Housing Stability: Unknown  Unable to Pay for Housing in the Last Year: No  Unstable Housing in the Last Year: No   Family History:  Family History  Problem Relation Age of Onset  Deep vein thrombosis (DVT or  abnormal blood clot formation) Mother  High blood pressure (Hypertension) Mother  Stroke Mother  Depression Father  Glaucoma Father  Anxiety Son  Alzheimer's disease Maternal Aunt  Alzheimer's disease Maternal Aunt  Anxiety Son   Review of Systems:   A 10+ ROS was performed, reviewed, and the pertinent orthopaedic findings are documented in the HPI.   Physical Examination:   BP (!) 140/86 (BP Location: Left upper arm, Patient Position: Sitting, BP Cuff Size: Adult)  Ht 160 cm (5\' 3" )  Wt 69.7 kg (153 lb 9.6 oz)  BMI 27.21 kg/m   Patient is a well-developed, well-nourished female in no acute distress. Patient has normal mood and affect. Patient is alert and oriented to person, place, and time.   HEENT: Atraumatic, normocephalic. Pupils equal and reactive to light. Extraocular motion intact. Noninjected sclera.  Cardiovascular: Regular rate and rhythm, with no murmurs, rubs, or gallops. Distal pulses palpable.  Respiratory: Lungs clear to auscultation bilaterally.   Left Knee: Soft tissue swelling: moderate Effusion: minimal Erythema: none Crepitance: mild Tenderness: medial Alignment: relative varus Mediolateral laxity: medial pseudolaxity Posterior sag: negative Patellar tracking: Good tracking without evidence of subluxation or tilt Atrophy: No significant atrophy.  Quadriceps tone was fair to good. Range  of motion: 0/3/117 degrees  Sensation intact over the saphenous, lateral sural cutaneous, superficial fibular, and deep fibular nerve distributions.  Tests Performed/Reviewed:  X-rays  3 views of the left knee were obtained. Images reveal severe loss of medial compartment joint space with osteophyte formation. No fractures or dislocations. No other osseous abnormality noted  I personally ordered and interpreted today's x-rays.  Impression:   ICD-10-CM  1. Primary osteoarthritis of left knee M17.12   Plan:   The patient has end-stage degenerative changes  of the left knee. It was explained to the patient that the condition is progressive in nature. Having failed conservative treatment, the patient has elected to proceed with a total joint arthroplasty. The patient will undergo a total joint arthroplasty with Dr. Ernest Pine. The risks of surgery, including blood clot and infection, were discussed with the patient. Measures to reduce these risks, including the use of anticoagulation, perioperative antibiotics, and early ambulation were discussed. The importance of postoperative physical therapy was discussed with the patient. The patient elects to proceed with surgery. The patient is instructed to stop all blood thinners prior to surgery. The patient is instructed to call the hospital the day before surgery to learn of the proper arrival time.   Contact our office with any questions or concerns. Follow up as indicated, or sooner should any new problems arise, if conditions worsen, or if they are otherwise concerned.   Michelene Gardener, PA -C [facility] [address] [place], [place] [address] Phone: [phone]  This note was generated in part with voice recognition software and I apologize for any typographical errors that were not detected and corrected.  Electronically signed by Michelene Gardener, PA at 03/20/2022 5:30 PM EDT

## 2022-03-24 ENCOUNTER — Ambulatory Visit: Payer: Medicare HMO | Admitting: Anesthesiology

## 2022-03-24 ENCOUNTER — Encounter
Admission: RE | Disposition: A | Discharge status: Home Health | Payer: Self-pay | Source: Home / Self Care | Attending: Orthopedic Surgery

## 2022-03-24 ENCOUNTER — Observation Stay
Admission: RE | Admit: 2022-03-24 | Discharge: 2022-03-25 | Disposition: A | Discharge status: Home Health | Payer: Medicare HMO | Attending: Orthopedic Surgery | Admitting: Orthopedic Surgery

## 2022-03-24 ENCOUNTER — Encounter: Payer: Self-pay | Admitting: Orthopedic Surgery

## 2022-03-24 ENCOUNTER — Ambulatory Visit: Payer: Medicare HMO | Admitting: Urgent Care

## 2022-03-24 ENCOUNTER — Other Ambulatory Visit: Payer: Self-pay

## 2022-03-24 ENCOUNTER — Observation Stay: Payer: Medicare HMO

## 2022-03-24 DIAGNOSIS — Z96651 Presence of right artificial knee joint: Secondary | ICD-10-CM | POA: Diagnosis not present

## 2022-03-24 DIAGNOSIS — M1712 Unilateral primary osteoarthritis, left knee: Principal | ICD-10-CM

## 2022-03-24 DIAGNOSIS — Z87891 Personal history of nicotine dependence: Secondary | ICD-10-CM | POA: Diagnosis not present

## 2022-03-24 DIAGNOSIS — Z79899 Other long term (current) drug therapy: Secondary | ICD-10-CM | POA: Diagnosis not present

## 2022-03-24 DIAGNOSIS — R7302 Impaired glucose tolerance (oral): Secondary | ICD-10-CM

## 2022-03-24 DIAGNOSIS — I10 Essential (primary) hypertension: Secondary | ICD-10-CM | POA: Insufficient documentation

## 2022-03-24 DIAGNOSIS — Z7982 Long term (current) use of aspirin: Secondary | ICD-10-CM | POA: Insufficient documentation

## 2022-03-24 DIAGNOSIS — E782 Mixed hyperlipidemia: Secondary | ICD-10-CM

## 2022-03-24 DIAGNOSIS — Z96659 Presence of unspecified artificial knee joint: Secondary | ICD-10-CM

## 2022-03-24 HISTORY — PX: KNEE ARTHROPLASTY: SHX992

## 2022-03-24 SURGERY — ARTHROPLASTY, KNEE, TOTAL, USING IMAGELESS COMPUTER-ASSISTED NAVIGATION
Anesthesia: General | Site: Knee | Laterality: Left

## 2022-03-24 MED ORDER — BISACODYL 10 MG RE SUPP: 10.0000 mg | Freq: Every day | RECTAL | Status: DC | PRN

## 2022-03-24 MED ORDER — ACETAMINOPHEN 325 MG PO TABS: 325.0000 mg | ORAL_TABLET | Freq: Four times a day (QID) | ORAL | Status: DC | PRN

## 2022-03-24 MED ORDER — FERROUS SULFATE 325 (65 FE) MG PO TABS
325.0000 mg | ORAL_TABLET | Freq: Two times a day (BID) | ORAL | Status: DC
  Administered 2022-03-24 – 2022-03-25 (×2): 325 mg via ORAL
  Filled 2022-03-24 (×2): qty 1

## 2022-03-24 MED ORDER — MENTHOL 3 MG MT LOZG: 1.0000 | LOZENGE | OROMUCOSAL | Status: DC | PRN

## 2022-03-24 MED ORDER — SODIUM CHLORIDE 0.9 % IV SOLN
INTRAVENOUS | Status: DC | PRN
  Administered 2022-03-24: 60 mL

## 2022-03-24 MED ORDER — TRANEXAMIC ACID-NACL 1000-0.7 MG/100ML-% IV SOLN
INTRAVENOUS | Status: AC
  Filled 2022-03-24: qty 100

## 2022-03-24 MED ORDER — TELMISARTAN-HCTZ 80-25 MG PO TABS
1.0000 | ORAL_TABLET | Freq: Every day | ORAL | Status: DC
Start: 2022-03-24 — End: 2022-03-24

## 2022-03-24 MED ORDER — SENNOSIDES-DOCUSATE SODIUM 8.6-50 MG PO TABS
1.0000 | ORAL_TABLET | Freq: Two times a day (BID) | ORAL | Status: DC
  Administered 2022-03-24 – 2022-03-25 (×2): 1 via ORAL
  Filled 2022-03-24 (×2): qty 1

## 2022-03-24 MED ORDER — MAGNESIUM HYDROXIDE 400 MG/5ML PO SUSP
30.0000 mL | Freq: Every day | ORAL | Status: DC
  Administered 2022-03-25: 30 mL via ORAL
  Filled 2022-03-24: qty 30

## 2022-03-24 MED ORDER — CELECOXIB 200 MG PO CAPS
ORAL_CAPSULE | ORAL | Status: AC
  Administered 2022-03-24: 400 mg via ORAL
  Filled 2022-03-24: qty 1

## 2022-03-24 MED ORDER — OMEGA-3-ACID ETHYL ESTERS 1 G PO CAPS
1.0000 g | ORAL_CAPSULE | Freq: Every day | ORAL | Status: DC
  Administered 2022-03-25: 1 g via ORAL
  Filled 2022-03-24: qty 1

## 2022-03-24 MED ORDER — DIPHENHYDRAMINE HCL 12.5 MG/5ML PO ELIX: 12.5000 mg | ORAL_SOLUTION | ORAL | Status: DC | PRN

## 2022-03-24 MED ORDER — BUPIVACAINE HCL (PF) 0.25 % IJ SOLN
INTRAMUSCULAR | Status: DC | PRN
  Administered 2022-03-24: 60 mL

## 2022-03-24 MED ORDER — OYSTER SHELL CALCIUM/D3 500-5 MG-MCG PO TABS
1.0000 | ORAL_TABLET | Freq: Every day | ORAL | Status: DC
  Administered 2022-03-25: 1 via ORAL
  Filled 2022-03-24: qty 1

## 2022-03-24 MED ORDER — CELECOXIB 200 MG PO CAPS
200.0000 mg | ORAL_CAPSULE | Freq: Two times a day (BID) | ORAL | Status: DC
  Administered 2022-03-24 – 2022-03-25 (×2): 200 mg via ORAL
  Filled 2022-03-24 (×2): qty 1

## 2022-03-24 MED ORDER — CEFAZOLIN SODIUM-DEXTROSE 2-4 GM/100ML-% IV SOLN
2.0000 g | Freq: Four times a day (QID) | INTRAVENOUS | Status: AC
  Administered 2022-03-24 (×2): 2 g via INTRAVENOUS
  Filled 2022-03-24 (×2): qty 100

## 2022-03-24 MED ORDER — SURGIPHOR WOUND IRRIGATION SYSTEM - OPTIME
TOPICAL | Status: DC | PRN
  Administered 2022-03-24: 450 mL via TOPICAL

## 2022-03-24 MED ORDER — CEFAZOLIN SODIUM-DEXTROSE 2-4 GM/100ML-% IV SOLN
INTRAVENOUS | Status: AC
  Filled 2022-03-24: qty 100

## 2022-03-24 MED ORDER — BUPIVACAINE HCL (PF) 0.25 % IJ SOLN
INTRAMUSCULAR | Status: AC
  Filled 2022-03-24: qty 30

## 2022-03-24 MED ORDER — ADULT MULTIVITAMIN W/MINERALS CH
1.0000 | ORAL_TABLET | Freq: Every day | ORAL | Status: DC
  Administered 2022-03-25: 1 via ORAL
  Filled 2022-03-24: qty 1

## 2022-03-24 MED ORDER — TRANEXAMIC ACID-NACL 1000-0.7 MG/100ML-% IV SOLN
INTRAVENOUS | Status: DC | PRN
  Administered 2022-03-24: 1000 mg via INTRAVENOUS

## 2022-03-24 MED ORDER — OXYCODONE HCL 5 MG PO TABS
10.0000 mg | ORAL_TABLET | ORAL | Status: DC | PRN
  Administered 2022-03-24 – 2022-03-25 (×2): 10 mg via ORAL
  Filled 2022-03-24 (×2): qty 2

## 2022-03-24 MED ORDER — SODIUM CHLORIDE FLUSH 0.9 % IV SOLN
INTRAVENOUS | Status: AC
  Filled 2022-03-24: qty 10

## 2022-03-24 MED ORDER — FLEET ENEMA 7-19 GM/118ML RE ENEM
1.0000 | ENEMA | Freq: Once | RECTAL | Status: DC | PRN
Start: 2022-03-24 — End: 2022-03-25

## 2022-03-24 MED ORDER — TRANEXAMIC ACID-NACL 1000-0.7 MG/100ML-% IV SOLN
INTRAVENOUS | Status: AC
  Administered 2022-03-24: 1000 mg via INTRAVENOUS
  Filled 2022-03-24: qty 100

## 2022-03-24 MED ORDER — ENOXAPARIN SODIUM 30 MG/0.3ML IJ SOSY
30.0000 mg | PREFILLED_SYRINGE | Freq: Two times a day (BID) | INTRAMUSCULAR | Status: DC
  Administered 2022-03-25: 30 mg via SUBCUTANEOUS
  Filled 2022-03-24: qty 0.3

## 2022-03-24 MED ORDER — OXYCODONE HCL 5 MG PO TABS: 5.0000 mg | ORAL_TABLET | Freq: Once | ORAL | Status: DC | PRN

## 2022-03-24 MED ORDER — OXYCODONE HCL 5 MG PO TABS: 5.0000 mg | ORAL_TABLET | ORAL | Status: DC | PRN

## 2022-03-24 MED ORDER — FAMOTIDINE 20 MG PO TABS
ORAL_TABLET | ORAL | Status: AC
  Filled 2022-03-24: qty 1

## 2022-03-24 MED ORDER — PHENOL 1.4 % MT LIQD: 1.0000 | OROMUCOSAL | Status: DC | PRN

## 2022-03-24 MED ORDER — BUPIVACAINE HCL (PF) 0.5 % IJ SOLN
INTRAMUSCULAR | Status: DC | PRN
  Administered 2022-03-24: 3 mL

## 2022-03-24 MED ORDER — PROPOFOL 1000 MG/100ML IV EMUL
INTRAVENOUS | Status: AC
  Filled 2022-03-24: qty 100

## 2022-03-24 MED ORDER — ACETAMINOPHEN 10 MG/ML IV SOLN
1000.0000 mg | Freq: Four times a day (QID) | INTRAVENOUS | Status: AC
  Administered 2022-03-24 – 2022-03-25 (×3): 1000 mg via INTRAVENOUS
  Filled 2022-03-24 (×2): qty 100

## 2022-03-24 MED ORDER — ROSUVASTATIN CALCIUM 10 MG PO TABS
20.0000 mg | ORAL_TABLET | Freq: Every evening | ORAL | Status: DC
  Administered 2022-03-24: 20 mg via ORAL
  Filled 2022-03-24: qty 2

## 2022-03-24 MED ORDER — CEFAZOLIN SODIUM-DEXTROSE 2-3 GM-%(50ML) IV SOLR
INTRAVENOUS | Status: DC | PRN
  Administered 2022-03-24: 2 g via INTRAVENOUS

## 2022-03-24 MED ORDER — SODIUM CHLORIDE 0.9 % IV SOLN: INTRAVENOUS | Status: DC

## 2022-03-24 MED ORDER — MIDAZOLAM HCL 2 MG/2ML IJ SOLN
INTRAMUSCULAR | Status: AC
  Filled 2022-03-24: qty 2

## 2022-03-24 MED ORDER — ONDANSETRON HCL 4 MG/2ML IJ SOLN: 4.0000 mg | Freq: Four times a day (QID) | INTRAMUSCULAR | Status: DC | PRN

## 2022-03-24 MED ORDER — ENSURE PRE-SURGERY PO LIQD
296.0000 mL | Freq: Once | ORAL | Status: AC
  Administered 2022-03-24: 296 mL via ORAL

## 2022-03-24 MED ORDER — ONDANSETRON HCL 4 MG/2ML IJ SOLN: 4.0000 mg | Freq: Once | INTRAMUSCULAR | Status: DC | PRN

## 2022-03-24 MED ORDER — DEXAMETHASONE SODIUM PHOSPHATE 10 MG/ML IJ SOLN
INTRAMUSCULAR | Status: AC
  Administered 2022-03-24: 8 mg via INTRAVENOUS
  Filled 2022-03-24: qty 1

## 2022-03-24 MED ORDER — FENTANYL CITRATE (PF) 100 MCG/2ML IJ SOLN: 25.0000 ug | INTRAMUSCULAR | Status: DC | PRN

## 2022-03-24 MED ORDER — METOCLOPRAMIDE HCL 5 MG PO TABS
10.0000 mg | ORAL_TABLET | Freq: Three times a day (TID) | ORAL | Status: DC
  Administered 2022-03-24 – 2022-03-25 (×2): 10 mg via ORAL
  Filled 2022-03-24 (×3): qty 2

## 2022-03-24 MED ORDER — HYDROCHLOROTHIAZIDE 25 MG PO TABS
25.0000 mg | ORAL_TABLET | Freq: Every day | ORAL | Status: DC
  Administered 2022-03-25: 25 mg via ORAL
  Filled 2022-03-24: qty 1

## 2022-03-24 MED ORDER — CHLORHEXIDINE GLUCONATE 0.12 % MT SOLN
OROMUCOSAL | Status: AC
  Filled 2022-03-24: qty 15

## 2022-03-24 MED ORDER — PROPOFOL 500 MG/50ML IV EMUL
INTRAVENOUS | Status: DC | PRN
  Administered 2022-03-24: 200 ug/kg/min via INTRAVENOUS

## 2022-03-24 MED ORDER — TRAMADOL HCL 50 MG PO TABS
50.0000 mg | ORAL_TABLET | ORAL | Status: DC | PRN
  Administered 2022-03-25: 50 mg via ORAL
  Filled 2022-03-24: qty 1

## 2022-03-24 MED ORDER — IRBESARTAN 150 MG PO TABS
300.0000 mg | ORAL_TABLET | Freq: Every day | ORAL | Status: DC
  Administered 2022-03-25: 300 mg via ORAL
  Filled 2022-03-24: qty 2

## 2022-03-24 MED ORDER — 0.9 % SODIUM CHLORIDE (POUR BTL) OPTIME
TOPICAL | Status: DC | PRN
  Administered 2022-03-24: 500 mL

## 2022-03-24 MED ORDER — TRANEXAMIC ACID-NACL 1000-0.7 MG/100ML-% IV SOLN: 1000.0000 mg | Freq: Once | INTRAVENOUS | Status: AC

## 2022-03-24 MED ORDER — ACETAMINOPHEN 10 MG/ML IV SOLN: 1000.0000 mg | Freq: Once | INTRAVENOUS | Status: DC | PRN

## 2022-03-24 MED ORDER — PHENYLEPHRINE HCL-NACL 20-0.9 MG/250ML-% IV SOLN
INTRAVENOUS | Status: DC | PRN
  Administered 2022-03-24: 40 ug/min via INTRAVENOUS

## 2022-03-24 MED ORDER — VITAMIN D 25 MCG (1000 UNIT) PO TABS
2000.0000 [IU] | ORAL_TABLET | Freq: Every day | ORAL | Status: DC
  Administered 2022-03-25: 2000 [IU] via ORAL
  Filled 2022-03-24: qty 2

## 2022-03-24 MED ORDER — SODIUM CHLORIDE 0.9 % IR SOLN
Status: DC | PRN
  Administered 2022-03-24: 3000 mL

## 2022-03-24 MED ORDER — ONDANSETRON HCL 4 MG PO TABS: 4.0000 mg | ORAL_TABLET | Freq: Four times a day (QID) | ORAL | Status: DC | PRN

## 2022-03-24 MED ORDER — MIDAZOLAM HCL 5 MG/5ML IJ SOLN
INTRAMUSCULAR | Status: DC | PRN
  Administered 2022-03-24: 2 mg via INTRAVENOUS

## 2022-03-24 MED ORDER — LACTATED RINGERS IV SOLN: INTRAVENOUS | Status: DC

## 2022-03-24 MED ORDER — ALUM & MAG HYDROXIDE-SIMETH 200-200-20 MG/5ML PO SUSP: 30.0000 mL | ORAL | Status: DC | PRN

## 2022-03-24 MED ORDER — OXYCODONE HCL 5 MG/5ML PO SOLN: 5.0000 mg | Freq: Once | ORAL | Status: DC | PRN

## 2022-03-24 MED ORDER — PANTOPRAZOLE SODIUM 40 MG PO TBEC
40.0000 mg | DELAYED_RELEASE_TABLET | Freq: Two times a day (BID) | ORAL | Status: DC
  Administered 2022-03-25: 40 mg via ORAL
  Filled 2022-03-24: qty 1

## 2022-03-24 MED ORDER — HYDROMORPHONE HCL 1 MG/ML IJ SOLN: 0.5000 mg | INTRAMUSCULAR | Status: DC | PRN

## 2022-03-24 MED ORDER — GABAPENTIN 300 MG PO CAPS
ORAL_CAPSULE | ORAL | Status: AC
  Filled 2022-03-24: qty 1

## 2022-03-24 MED ORDER — BUPIVACAINE LIPOSOME 1.3 % IJ SUSP
INTRAMUSCULAR | Status: AC
Start: 2022-03-24 — End: ?
  Filled 2022-03-24: qty 20

## 2022-03-24 MED ORDER — CARVEDILOL 12.5 MG PO TABS
12.5000 mg | ORAL_TABLET | Freq: Two times a day (BID) | ORAL | Status: DC
  Administered 2022-03-24 – 2022-03-25 (×2): 12.5 mg via ORAL
  Filled 2022-03-24 (×2): qty 1

## 2022-03-24 SURGICAL SUPPLY — 77 items
ATTUNE PS FEM LT SZ 5 CEM KNEE (Femur) IMPLANT
ATTUNE PSRP INSR SZ5 8 KNEE (Insert) IMPLANT
BASE TIBIAL ROT PLAT SZ 5 KNEE (Knees) IMPLANT
BATTERY INSTRU NAVIGATION (MISCELLANEOUS) ×4 IMPLANT
BLADE SAW 70X12.5 (BLADE) ×1 IMPLANT
BLADE SAW 90X13X1.19 OSCILLAT (BLADE) ×1 IMPLANT
BLADE SAW 90X25X1.19 OSCILLAT (BLADE) ×1 IMPLANT
CEMENT HV SMART SET (Cement) IMPLANT
COOLER POLAR GLACIER W/PUMP (MISCELLANEOUS) ×1 IMPLANT
CUFF TOURN SGL QUICK 24 (TOURNIQUET CUFF)
CUFF TOURN SGL QUICK 34 (TOURNIQUET CUFF)
CUFF TRNQT CYL 24X4X16.5-23 (TOURNIQUET CUFF) IMPLANT
CUFF TRNQT CYL 34X4.125X (TOURNIQUET CUFF) IMPLANT
DRAPE 3/4 80X56 (DRAPES) ×1 IMPLANT
DRAPE INCISE IOBAN 66X45 STRL (DRAPES) IMPLANT
DRESSING OWENS FABRIC (GAUZE/BANDAGES/DRESSINGS) IMPLANT
DRSG DERMACEA NONADH 3X8 (GAUZE/BANDAGES/DRESSINGS) ×1 IMPLANT
DRSG MEPILEX SACRM 8.7X9.8 (GAUZE/BANDAGES/DRESSINGS) ×1 IMPLANT
DRSG OPSITE POSTOP 4X12 (GAUZE/BANDAGES/DRESSINGS) IMPLANT
DRSG OPSITE POSTOP 4X14 (GAUZE/BANDAGES/DRESSINGS) ×1 IMPLANT
DRSG TEGADERM 4X4.75 (GAUZE/BANDAGES/DRESSINGS) ×1 IMPLANT
DURAPREP 26ML APPLICATOR (WOUND CARE) ×2 IMPLANT
ELECT CAUTERY BLADE 6.4 (BLADE) ×1 IMPLANT
ELECT REM PT RETURN 9FT ADLT (ELECTROSURGICAL) ×1
ELECTRODE REM PT RTRN 9FT ADLT (ELECTROSURGICAL) ×1 IMPLANT
EX-PIN ORTHOLOCK NAV 4X150 (PIN) ×2 IMPLANT
GLOVE BIO SURGEON STRL SZ7 (GLOVE) ×2 IMPLANT
GLOVE BIOGEL M STRL SZ7.5 (GLOVE) ×2 IMPLANT
GLOVE BIOGEL PI IND STRL 8 (GLOVE) ×1 IMPLANT
GLOVE BIOGEL PI INDICATOR 8 (GLOVE) ×1
GLOVE BIOGEL PI ORTHO PRO 7.5 (GLOVE) ×2
GLOVE PI ORTHO PRO STRL 7.5 (GLOVE) ×2 IMPLANT
GLOVE SURG UNDER POLY LF SZ7.5 (GLOVE) ×1 IMPLANT
GOWN STRL REUS W/ TWL LRG LVL3 (GOWN DISPOSABLE) ×2 IMPLANT
GOWN STRL REUS W/ TWL XL LVL3 (GOWN DISPOSABLE) ×1 IMPLANT
GOWN STRL REUS W/TWL LRG LVL3 (GOWN DISPOSABLE) ×2
GOWN STRL REUS W/TWL XL LVL3 (GOWN DISPOSABLE) ×1
HEMOVAC 400CC 10FR (MISCELLANEOUS) ×1 IMPLANT
HOLDER FOLEY CATH W/STRAP (MISCELLANEOUS) ×1 IMPLANT
HOLSTER ELECTROSUGICAL PENCIL (MISCELLANEOUS) ×1 IMPLANT
HOOD PEEL AWAY FLYTE STAYCOOL (MISCELLANEOUS) ×2 IMPLANT
IV NS IRRIG 3000ML ARTHROMATIC (IV SOLUTION) ×1 IMPLANT
KIT TURNOVER KIT A (KITS) ×1 IMPLANT
KNIFE SCULPS 14X20 (INSTRUMENTS) ×1 IMPLANT
MANIFOLD NEPTUNE II (INSTRUMENTS) ×2 IMPLANT
NDL SPNL 20GX3.5 QUINCKE YW (NEEDLE) ×2 IMPLANT
NEEDLE SPNL 20GX3.5 QUINCKE YW (NEEDLE) ×2 IMPLANT
NS IRRIG 500ML POUR BTL (IV SOLUTION) ×1 IMPLANT
PACK TOTAL KNEE (MISCELLANEOUS) ×1 IMPLANT
PAD ABD DERMACEA PRESS 5X9 (GAUZE/BANDAGES/DRESSINGS) ×2 IMPLANT
PAD WRAPON POLAR KNEE (MISCELLANEOUS) ×1 IMPLANT
PATELLA MEDIAL ATTUN 35MM KNEE (Knees) IMPLANT
PIN DRILL FIX HALF THREAD (BIT) ×2 IMPLANT
PIN DRILL QUICK PACK ×2 IMPLANT
PIN FIXATION 1/8DIA X 3INL (PIN) ×1 IMPLANT
PULSAVAC PLUS IRRIG FAN TIP (DISPOSABLE) ×1
SOL PREP PVP 2OZ (MISCELLANEOUS) ×1
SOLUTION IRRIG SURGIPHOR (IV SOLUTION) ×1 IMPLANT
SOLUTION PREP PVP 2OZ (MISCELLANEOUS) ×1 IMPLANT
SPONGE DRAIN TRACH 4X4 STRL 2S (GAUZE/BANDAGES/DRESSINGS) ×1 IMPLANT
STAPLER SKIN PROX 35W (STAPLE) ×1 IMPLANT
STOCKINETTE IMPERV 14X48 (MISCELLANEOUS) IMPLANT
STRAP TIBIA SHORT (MISCELLANEOUS) ×1 IMPLANT
SUCTION FRAZIER HANDLE 10FR (MISCELLANEOUS) ×1
SUCTION TUBE FRAZIER 10FR DISP (MISCELLANEOUS) ×1 IMPLANT
SUT VIC AB 0 CT1 36 (SUTURE) ×2 IMPLANT
SUT VIC AB 1 CT1 36 (SUTURE) ×2 IMPLANT
SUT VIC AB 2-0 CT2 27 (SUTURE) ×1 IMPLANT
SYR 30ML LL (SYRINGE) ×2 IMPLANT
TIBIAL BASE ROT PLAT SZ 5 KNEE (Knees) ×1 IMPLANT
TIP FAN IRRIG PULSAVAC PLUS (DISPOSABLE) ×1 IMPLANT
TOWEL OR 17X26 4PK STRL BLUE (TOWEL DISPOSABLE) IMPLANT
TOWER CARTRIDGE SMART MIX (DISPOSABLE) ×1 IMPLANT
TRAP FLUID SMOKE EVACUATOR (MISCELLANEOUS) ×1 IMPLANT
TRAY FOLEY MTR SLVR 16FR STAT (SET/KITS/TRAYS/PACK) ×1 IMPLANT
WATER STERILE IRR 1000ML POUR (IV SOLUTION) IMPLANT
WRAPON POLAR PAD KNEE (MISCELLANEOUS) ×1

## 2022-03-24 NOTE — Addendum Note (Signed)
Addendum  created 03/24/22 1245 by Junious Silk, CRNA   Intraprocedure Meds edited

## 2022-03-24 NOTE — Evaluation (Signed)
Physical Therapy Evaluation Patient Details Name: Cassandra Ball MRN: [id number] DOB: Apr 17, 1941 Today's Date: 03/24/2022  History of Present Illness  Pt is an 81 yo female s/p L TKA. PMH of recent R TKA (01/15/2022), HTN.  Clinical Impression  Patient alert, agreeable to PT. Initially reported 2/10 in L knee to start, increased to 4/10 and RN notified of pain and request for medication. Pt planning to discharge home with family to assist as needed (son/daughter to provide 24/7 assist as needed). ModI-I at baseline.   She performed several exercises with tactile cues, no physical assistance. Supine to sit modI with bed rails, good sitting balance. She was able to perform transfers with supervision and RW, and ambulated ~72ft with RW. Noted for step through gait pattern no LOB, but ambulation did increase her pain. Returned to recliner with all needs in reach.  Overall the patient demonstrated deficits (see "PT Problem List") that impede the patient's functional abilities, safety, and mobility and would benefit from skilled PT intervention. Recommendation at this time is HHPT with intermittent supervision/assistance.        Recommendations for follow up therapy are one component of a multi-disciplinary discharge planning process, led by the attending physician.  Recommendations may be updated based on patient status, additional functional criteria and insurance authorization.  Follow Up Recommendations Home health PT      Assistance Recommended at Discharge Intermittent Supervision/Assistance  Patient can return home with the following  A little help with bathing/dressing/bathroom;Assistance with cooking/housework;Assist for transportation;Help with stairs or ramp for entrance;Direct supervision/assist for medications management    Equipment Recommendations None recommended by PT  Recommendations for Other Services       Functional Status Assessment Patient has had a recent decline in their  functional status and demonstrates the ability to make significant improvements in function in a reasonable and predictable amount of time.     Precautions / Restrictions Precautions Precautions: Fall;Knee Precaution Booklet Issued: Yes (comment) Restrictions Weight Bearing Restrictions: Yes LLE Weight Bearing: Weight bearing as tolerated      Mobility  Bed Mobility Overal bed mobility: Modified Independent                  Transfers Overall transfer level: Needs assistance Equipment used: Rolling walker (2 wheels) Transfers: Sit to/from Stand, Bed to chair/wheelchair/BSC Sit to Stand: Supervision   Step pivot transfers: Supervision            Ambulation/Gait Ambulation/Gait assistance: Supervision Gait Distance (Feet): 60 Feet Assistive device: Rolling walker (2 wheels)         General Gait Details: step through pattern, 1-2 instances of slowing pace but overall steady, no physical assisatnce needed  Stairs            Wheelchair Mobility    Modified Rankin (Stroke Patients Only)       Balance Overall balance assessment: Needs assistance Sitting-balance support: Feet supported Sitting balance-Leahy Scale: Good     Standing balance support: Reliant on assistive device for balance Standing balance-Leahy Scale: Fair                               Pertinent Vitals/Pain Pain Assessment Pain Assessment: 0-10 Pain Score: 4  Pain Location: L knee Pain Descriptors / Indicators: Aching, Grimacing, Sore Pain Intervention(s): Patient requesting pain meds-RN notified, Limited activity within patient's tolerance, Monitored during session, Repositioned, Ice applied    Home Living Family/patient expects to be discharged  to:: Private residence Living Arrangements: Alone Available Help at Discharge: Family;Available 24 hours/day (son will be staying with her post surgery, daughter will help out at night as needed) Type of Home: House Home  Access: Level entry         Home Equipment: Rollator (4 wheels);Cane - single Librarian, academic (2 wheels);Shower seat;Grab bars - tub/shower;BSC/3in1 Additional Comments: [facility];    Prior Function Prior Level of Function : Independent/Modified Independent                     Higher education careers adviser        Extremity/Trunk Assessment   Upper Extremity Assessment Upper Extremity Assessment: Overall WFL for tasks assessed    Lower Extremity Assessment Lower Extremity Assessment:  (s/p L TKA, able to move BLE against gravity without assistance)    Cervical / Trunk Assessment Cervical / Trunk Assessment: Normal  Communication   Communication: No difficulties  Cognition Arousal/Alertness: Awake/alert Behavior During Therapy: WFL for tasks assessed/performed Overall Cognitive Status: Within Functional Limits for tasks assessed                                          General Comments      Exercises Total Joint Exercises Ankle Circles/Pumps: AROM, Both, 10 reps Quad Sets: AROM, Left, 10 reps Heel Slides: AROM, Strengthening, Supine, Seated, 10 reps, 5 reps Goniometric ROM: 0-85degrees   Assessment/Plan    PT Assessment Patient needs continued PT services  PT Problem List Decreased strength;Decreased mobility;Decreased activity tolerance;Pain;Decreased balance;Decreased knowledge of use of DME;Decreased range of motion       PT Treatment Interventions DME instruction;Therapeutic exercise;Gait training;Balance training;Stair training;Neuromuscular re-education;Functional mobility training;Therapeutic activities;Patient/family education    PT Goals (Current goals can be found in the Care Plan section)  Acute Rehab PT Goals Patient Stated Goal: to go home PT Goal Formulation: With patient Time For Goal Achievement: 04/07/22 Potential to Achieve Goals: Good    Frequency BID     Co-evaluation               AM-PAC PT "6  Clicks" Mobility  Outcome Measure Help needed turning from your back to your side while in a flat bed without using bedrails?: None Help needed moving from lying on your back to sitting on the side of a flat bed without using bedrails?: None Help needed moving to and from a bed to a chair (including a wheelchair)?: None Help needed standing up from a chair using your arms (e.g., wheelchair or bedside chair)?: None Help needed to walk in hospital room?: A Little Help needed climbing 3-5 steps with a railing? : A Little 6 Click Score: 22    End of Session Equipment Utilized During Treatment: Gait belt Activity Tolerance: Patient tolerated treatment well Patient left: in chair;with chair alarm set;with call bell/phone within reach;with family/visitor present Nurse Communication: Mobility status PT Visit Diagnosis: Other abnormalities of gait and mobility (R26.89);Difficulty in walking, not elsewhere classified (R26.2);Muscle weakness (generalized) (M62.81);Pain Pain - Right/Left: Left Pain - part of body: Knee    Time: 1355-1415 PT Time Calculation (min) (ACUTE ONLY): 20 min   Charges:   PT Evaluation $PT Eval Low Complexity: 1 Low PT Treatments $Therapeutic Activity: 8-22 mins        Olga Coaster PT, DPT 2:44 PM,03/24/22

## 2022-03-24 NOTE — Transfer of Care (Signed)
Immediate Anesthesia Transfer of Care Note  Patient: Cassandra Ball  Procedure(s) Performed: COMPUTER ASSISTED TOTAL KNEE ARTHROPLASTY (Left: Knee)  Patient Location: PACU  Anesthesia Type:Spinal  Level of Consciousness: awake, alert  and oriented  Airway & Oxygen Therapy: Patient Spontanous Breathing  Post-op Assessment: Report given to RN and Post -op Vital signs reviewed and stable  Post vital signs: Reviewed and stable  Last Vitals:  Vitals Value Taken Time  BP    Temp    Pulse 63 03/24/22 1039  Resp 15 03/24/22 1039  SpO2 95 % 03/24/22 1039  Vitals shown include unvalidated device data.  Last Pain: There were no vitals filed for this visit.       Complications: No notable events documented.

## 2022-03-24 NOTE — Anesthesia Preprocedure Evaluation (Addendum)
Anesthesia Evaluation  Patient identified by MRN, date of birth, ID band Patient awake    Reviewed: Allergy & Precautions, NPO status , Patient's Chart, lab work & pertinent test results  History of Anesthesia Complications Negative for: history of anesthetic complications  Airway Mallampati: IV   Neck ROM: Full    Dental  (+) Missing   Pulmonary neg pulmonary ROS,    Pulmonary exam normal breath sounds clear to auscultation       Cardiovascular hypertension, Normal cardiovascular exam Rhythm:Regular Rate:Normal  ECG 01/03/22: normal   Neuro/Psych negative neurological ROS     GI/Hepatic negative GI ROS,   Endo/Other  negative endocrine ROS  Renal/GU Renal disease (nephrolithiasis)     Musculoskeletal  (+) Arthritis ,   Abdominal   Peds  Hematology negative hematology ROS (+)   Anesthesia Other Findings   Reproductive/Obstetrics                            Anesthesia Physical Anesthesia Plan  ASA: 2  Anesthesia Plan: General and Spinal   Post-op Pain Management:    Induction: Intravenous  PONV Risk Score and Plan: 3 and Propofol infusion, TIVA, Treatment may vary due to age or medical condition and Ondansetron  Airway Management Planned: Natural Airway and Nasal Cannula  Additional Equipment:   Intra-op Plan:   Post-operative Plan:   Informed Consent: I have reviewed the patients History and Physical, chart, labs and discussed the procedure including the risks, benefits and alternatives for the proposed anesthesia with the patient or authorized representative who has indicated his/her understanding and acceptance.       Plan Discussed with: CRNA  Anesthesia Plan Comments: (Plan for spinal and GA with natural airway, LMA/GETA backup.  Patient consented for risks of anesthesia including but not limited to:  - adverse reactions to medications - damage to eyes, teeth, lips  or other oral mucosa - nerve damage due to positioning  - sore throat or hoarseness - headache, bleeding, infection, nerve damage 2/2 spinal - damage to heart, brain, nerves, lungs, other parts of body or loss of life  Informed patient about role of CRNA in peri- and intra-operative care.  Patient voiced understanding.)        Anesthesia Quick Evaluation

## 2022-03-24 NOTE — Op Note (Signed)
OPERATIVE NOTE  DATE OF SURGERY:  03/24/2022  PATIENT NAME:  Cassandra Ball   DOB: [birth year]  MRN: [id number]  PRE-OPERATIVE DIAGNOSIS: Degenerative arthrosis of the left knee, primary  POST-OPERATIVE DIAGNOSIS:  Same  PROCEDURE:  Left total knee arthroplasty using computer-assisted navigation  SURGEON:  Jena Gauss. M.D.  ANESTHESIA: spinal  ESTIMATED BLOOD LOSS: 50 mL  FLUIDS REPLACED: 1500 mL of crystalloid  TOURNIQUET TIME: 83 minutes  DRAINS: 2 medium Hemovac drains  SOFT TISSUE RELEASES: Anterior cruciate ligament, posterior cruciate ligament, deep medial collateral ligament, patellofemoral ligament  IMPLANTS UTILIZED: DePuy Attune size 5 posterior stabilized femoral component (cemented), size 5 rotating platform tibial component (cemented), 35 mm medialized dome patella (cemented), and an 8 mm stabilized rotating platform polyethylene insert.  INDICATIONS FOR SURGERY: [name] is a 81 y.o. year old female with a long history of progressive knee pain. X-rays demonstrated severe degenerative changes in tricompartmental fashion. The patient had not seen any significant improvement despite conservative nonsurgical intervention. After discussion of the risks and benefits of surgical intervention, the patient expressed understanding of the risks benefits and agree with plans for total knee arthroplasty.   The risks, benefits, and alternatives were discussed at length including but not limited to the risks of infection, bleeding, nerve injury, stiffness, blood clots, the need for revision surgery, cardiopulmonary complications, among others, and they were willing to proceed.  PROCEDURE IN DETAIL: The patient was brought into the operating room and, after adequate spinal anesthesia was achieved, a tourniquet was placed on the patient's upper thigh. The patient's knee and leg were cleaned and prepped with alcohol and DuraPrep and draped in the usual sterile fashion. A  "timeout" was performed as per usual protocol. The lower extremity was exsanguinated using an Esmarch, and the tourniquet was inflated to 300 mmHg. An anterior longitudinal incision was made followed by a standard mid vastus approach. The deep fibers of the medial collateral ligament were elevated in a subperiosteal fashion off of the medial flare of the tibia so as to maintain a continuous soft tissue sleeve. The patella was subluxed laterally and the patellofemoral ligament was incised. Inspection of the knee demonstrated severe degenerative changes with full-thickness loss of articular cartilage. Osteophytes were debrided using a rongeur. Anterior and posterior cruciate ligaments were excised. Two 4.0 mm Schanz pins were inserted in the femur and into the tibia for attachment of the array of trackers used for computer-assisted navigation. Hip center was identified using a circumduction technique. Distal landmarks were mapped using the computer. The distal femur and proximal tibia were mapped using the computer. The distal femoral cutting guide was positioned using computer-assisted navigation so as to achieve a 5 distal valgus cut. The femur was sized and it was felt that a size 5 femoral component was appropriate. A size 5 femoral cutting guide was positioned and the anterior cut was performed and verified using the computer. This was followed by completion of the posterior and chamfer cuts. Femoral cutting guide for the central box was then positioned in the center box cut was performed.  Attention was then directed to the proximal tibia. Medial and lateral menisci were excised. The extramedullary tibial cutting guide was positioned using computer-assisted navigation so as to achieve a 0 varus-valgus alignment and 3 posterior slope. The cut was performed and verified using the computer. The proximal tibia was sized and it was felt that a size 5 tibial tray was appropriate. Tibial and femoral trials were  inserted followed  by insertion of an 8 mm polyethylene insert. This allowed for excellent mediolateral soft tissue balancing both in flexion and in full extension. Finally, the patella was cut and prepared so as to accommodate a 35 mm medialized dome patella. A patella trial was placed and the knee was placed through a range of motion with excellent patellar tracking appreciated. The femoral trial was removed after debridement of posterior osteophytes. The central post-hole for the tibial component was reamed followed by insertion of a keel punch. Tibial trials were then removed. Cut surfaces of bone were irrigated with copious amounts of normal saline using pulsatile lavage and then suctioned dry. Polymethylmethacrylate cement was prepared in the usual fashion using a vacuum mixer. Cement was applied to the cut surface of the proximal tibia as well as along the undersurface of a size 5 rotating platform tibial component. Tibial component was positioned and impacted into place. Excess cement was removed using Personal assistant. Cement was then applied to the cut surfaces of the femur as well as along the posterior flanges of the size 5 femoral component. The femoral component was positioned and impacted into place. Excess cement was removed using Personal assistant. An 8 mm polyethylene trial was inserted and the knee was brought into full extension with steady axial compression applied. Finally, cement was applied to the backside of a 35 mm medialized dome patella and the patellar component was positioned and patellar clamp applied. Excess cement was removed using Personal assistant. After adequate curing of the cement, the tourniquet was deflated after a total tourniquet time of 83 minutes. Hemostasis was achieved using electrocautery. The knee was irrigated with copious amounts of normal saline using pulsatile lavage followed by 450 ml of Surgiphor and then suctioned dry. 20 mL of 1.3% Exparel and 60 mL of 0.25% Marcaine  in 40 mL of normal saline was injected along the posterior capsule, medial and lateral gutters, and along the arthrotomy site. An 8 mm stabilized rotating platform polyethylene insert was inserted and the knee was placed through a range of motion with excellent mediolateral soft tissue balancing appreciated and excellent patellar tracking noted. 2 medium drains were placed in the wound bed and brought out through separate stab incisions. The medial parapatellar portion of the incision was reapproximated using interrupted sutures of #1 Vicryl. Subcutaneous tissue was approximated in layers using first #0 Vicryl followed #2-0 Vicryl. The skin was approximated with skin staples. A sterile dressing was applied.  The patient tolerated the procedure well and was transported to the recovery room in stable condition.    Ran Tullis P. Angie Fava., M.D.

## 2022-03-24 NOTE — Anesthesia Procedure Notes (Signed)
Spinal  Patient location during procedure: OR Start time: 03/24/2022 7:22 AM End time: 03/24/2022 7:26 AM Reason for block: surgical anesthesia Staffing Performed: resident/CRNA  Resident/CRNA: Nelda Marseille, CRNA Performed by: Nelda Marseille, CRNA Authorized by: Darrin Nipper, MD   Preanesthetic Checklist Completed: patient identified, IV checked, site marked, risks and benefits discussed, surgical consent, monitors and equipment checked, pre-op evaluation and timeout performed Spinal Block Patient position: sitting Prep: ChloraPrep Patient monitoring: heart rate, continuous pulse ox, blood pressure and cardiac monitor Approach: midline Location: L3-4 Injection technique: single-shot Needle Needle type: Whitacre and Introducer  Needle gauge: 25 G Needle length: 9 cm Assessment Sensory level: T10 Events: CSF return Additional Notes Sterile aseptic technique used throughout the procedure.  Negative paresthesia. Negative blood return. Positive free-flowing CSF. Expiration date of kit checked and confirmed. Patient tolerated procedure well, without complications.

## 2022-03-24 NOTE — Anesthesia Procedure Notes (Signed)
Date/Time: 03/24/2022 7:55 AM  Performed by: Junious Silk, CRNAPre-anesthesia Checklist: Patient identified, Emergency Drugs available, Suction available, Patient being monitored and Timeout performed Oxygen Delivery Method: Simple face mask

## 2022-03-24 NOTE — Anesthesia Postprocedure Evaluation (Signed)
Anesthesia Post Note  Patient: Cassandra Ball  Procedure(s) Performed: COMPUTER ASSISTED TOTAL KNEE ARTHROPLASTY (Left: Knee)  Patient location during evaluation: PACU Anesthesia Type: Spinal Level of consciousness: awake and alert, oriented and patient cooperative Pain management: pain level controlled Vital Signs Assessment: post-procedure vital signs reviewed and stable Respiratory status: spontaneous breathing, nonlabored ventilation and respiratory function stable Cardiovascular status: blood pressure returned to baseline and stable Postop Assessment: no headache, spinal receding and adequate PO intake Anesthetic complications: no   No notable events documented.   Last Vitals:  Vitals:   03/24/22 1135 03/24/22 1148  BP: (!) 115/32 (!) 119/52  Pulse: 62 68  Resp: 10 18  Temp:  36.6 C  SpO2: 99% 100%    Last Pain:  Vitals:   03/24/22 1155  PainSc: 0-No pain                 Reed Breech

## 2022-03-24 NOTE — Plan of Care (Signed)

## 2022-03-24 NOTE — Interval H&P Note (Signed)
History and Physical Interval Note:  03/24/2022 6:05 AM  Cassandra Ball  has presented today for surgery, with the diagnosis of Primary osteoarthritis of left knee M17.11.  The various methods of treatment have been discussed with the patient and family. After consideration of risks, benefits and other options for treatment, the patient has consented to  Procedure(s): COMPUTER ASSISTED TOTAL KNEE ARTHROPLASTY - RNFA (Left) as a surgical intervention.  The patient's history has been reviewed, patient examined, no change in status, stable for surgery.  I have reviewed the patient's chart and labs.  Questions were answered to the patient's satisfaction.     [name] P Winton Offord

## 2022-03-25 ENCOUNTER — Encounter: Payer: Self-pay | Admitting: Orthopedic Surgery

## 2022-03-25 DIAGNOSIS — M1712 Unilateral primary osteoarthritis, left knee: Secondary | ICD-10-CM | POA: Diagnosis not present

## 2022-03-25 MED ORDER — CELECOXIB 200 MG PO CAPS: 200.0000 mg | ORAL_CAPSULE | Freq: Two times a day (BID) | ORAL | 0 refills | Status: AC

## 2022-03-25 MED ORDER — OXYCODONE HCL 5 MG PO TABS
5.0000 mg | ORAL_TABLET | ORAL | 0 refills | Status: AC | PRN
Start: 2022-03-25 — End: ?

## 2022-03-25 MED ORDER — TRAMADOL HCL 50 MG PO TABS: 50.0000 mg | ORAL_TABLET | ORAL | 0 refills | Status: AC | PRN

## 2022-03-25 MED ORDER — ENOXAPARIN SODIUM 40 MG/0.4ML IJ SOSY: 40.0000 mg | PREFILLED_SYRINGE | INTRAMUSCULAR | 0 refills | Status: AC

## 2022-03-25 NOTE — Discharge Summary (Signed)
Physician Discharge Summary  Patient ID: Cassandra Ball MRN: [id number] DOB/AGE: 02/26/1941 81 y.o.  Admit date: 03/24/2022 Discharge date: 03/25/2022  Admission Diagnoses:  Total knee replacement status [Z96.659]  Surgeries:Procedure(s): Left total knee arthroplasty using computer-assisted navigation   SURGEON:  Jena Gauss. M.D.   ANESTHESIA: spinal   ESTIMATED BLOOD LOSS: 50 mL   FLUIDS REPLACED: 1500 mL of crystalloid   TOURNIQUET TIME: 83 minutes   DRAINS: 2 medium Hemovac drains   SOFT TISSUE RELEASES: Anterior cruciate ligament, posterior cruciate ligament, deep medial collateral ligament, patellofemoral ligament   IMPLANTS UTILIZED: DePuy Attune size 5 posterior stabilized femoral component (cemented), size 5 rotating platform tibial component (cemented), 35 mm medialized dome patella (cemented), and an 8 mm stabilized rotating platform polyethylene insert.  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 female who was admitted 03/24/2022 with a diagnosis of left knee osteoarthritis and went to the operating room on 03/24/2022 and underwent left total knee arthoplasty. The patient received perioperative antibiotics for prophylaxis (see below). The patient tolerated the procedure well and was transported to PACU in  stable condition. After meeting PACU criteria, the patient was subsequently transferred to the Orthopaedics/Rehabilitation unit.   The patient received DVT prophylaxis in the form of early mobilization, Lovenox, TED hose, and SCDs . A sacral pad had been placed and heels were elevated off of the bed with rolled towels in order to protect skin integrity. Foley catheter was discontinued on postoperative day #0. Wound drains were discontinued on postoperative day #1. The surgical incision was healing well without signs of infection.  Physical therapy was initiated postoperatively for transfers, gait training, and strengthening. Occupational therapy was initiated for activities of daily living and evaluation for assisted devices. Rehabilitation goals were reviewed in detail with the patient. The patient made steady progress with physical therapy and physical therapy recommended discharge to Home.   The patient achieved the preliminary goals of this hospitalization and was felt to be medically and orthopaedically appropriate for discharge.  She was given perioperative antibiotics:  Anti-infectives (From admission, onward)    Start     Dose/Rate Route Frequency Ordered Stop   03/24/22 1400  ceFAZolin (ANCEF) IVPB 2g/100 mL premix        2 g 200 mL/hr over 30 Minutes Intravenous Every 6 hours 03/24/22 1151 03/24/22 2028   03/24/22 0620  ceFAZolin (ANCEF) 2-4 GM/100ML-% IVPB       Note to Pharmacy: Kerman Passey, Cryst: cabinet override      03/24/22 0620 03/24/22 1829   03/24/22 0600  ceFAZolin (ANCEF) IVPB 2g/100 mL premix  Status:  Discontinued        2 g 200 mL/hr over 30 Minutes Intravenous On call to O.R. 03/23/22 2307 03/24/22 6553     .  Recent vital signs:  Vitals:   03/25/22 0517 03/25/22 0728  BP: (!) 106/46 (!) 118/47  Pulse: 67 (!) 58  Resp: 16   Temp: 98 F (36.7 C) 98.8 F (37.1 C)  SpO2: 97% 98%    Recent laboratory studies:  No results for input(s): "WBC", "HGB", "HCT",  "PLT", "K", "CL", "CO2", "BUN", "CREATININE", "GLUCOSE", "CALCIUM", "LABPT", "INR" in the last 72 hours.  Diagnostic Studies: DG Knee Left Port  Result Date: 03/24/2022 CLINICAL DATA:  Follow-up knee replacement EXAM: PORTABLE LEFT KNEE - 1-2 VIEW COMPARISON:  06/17/2021 FINDINGS: Total knee arthroplasty has a good appearance. No unexpected finding or complication. Drain in place in the suprapatellar recess. IMPRESSION: Good appearance following total knee arthroplasty. Electronically Signed   By: Paulina Fusi M.D.   On: 03/24/2022 11:04    Discharge Medications:   Allergies as of 03/25/2022   No Known Allergies      Medication List     TAKE these medications    acetaminophen 500 MG tablet Commonly known as: TYLENOL Take 1,000 mg by mouth every 6 (six) hours as needed.   CALCIUM 600 + D PO Take 1 Dose by mouth daily.   carvedilol 12.5 MG tablet Commonly known as: COREG Take 12.5 mg by mouth 2 (two) times daily with a meal.   celecoxib 200 MG capsule Commonly known as: CELEBREX Take 1 capsule (200 mg total) by mouth 2 (two) times daily.   diclofenac Sodium 1 % Gel Commonly known as: VOLTAREN Apply topically as needed.   enoxaparin 40 MG/0.4ML injection Commonly known as: LOVENOX Inject 0.4 mLs (40 mg total) into the skin daily for 14 days.   hydrocortisone 2.5 % cream Apply topically 2 (two) times daily.   multivitamin-iron-minerals-folic acid Tabs tablet Take 1 tablet by mouth daily.   OMEGA-3 FATTY ACIDS PO Take 1 Dose by mouth daily.   oxyCODONE 5 MG immediate release tablet Commonly known as: Oxy IR/ROXICODONE Take 1 tablet (5 mg total) by mouth every 4 (four) hours as needed for moderate pain (pain score 4-6).   rosuvastatin 20 MG tablet Commonly known as: CRESTOR Take 20 mg by mouth daily.   telmisartan-hydrochlorothiazide 80-25 MG tablet Commonly known as: MICARDIS HCT Take 1 tablet by mouth daily.   traMADol 50 MG tablet Commonly known as:  ULTRAM Take 1 tablet (50 mg total) by mouth every 4 (four) hours as needed for moderate pain.   Vitamin D3 50 MCG (2000 UT) Tabs Take 1 Dose by mouth daily.               Durable Medical Equipment  (From admission, onward)           Start     Ordered   03/24/22 1152  DME Walker rolling  Once       Question:  Patient needs a walker to treat with the following condition  Answer:  Total knee replacement status   03/24/22 1151   03/24/22 1152  DME Bedside commode  Once       Question:  Patient needs a bedside commode to treat with the following condition  Answer:  Total knee replacement status   03/24/22 1151            Disposition: Home with home health PT     Follow-up Information     Madelyn Flavors, PA-C Follow up on 04/08/2022.   Specialty: Orthopedic Surgery Why: at 1:45pm Contact information: [address] [facility] [place] [address]  [phone]         Donato Heinz, MD Follow up on 05/06/2022.   Specialty: Orthopedic Surgery Why: at 9:45am Contact information: [address] [facility] [place] [address] [phone]                  Lasandra Beech, PA-C 03/25/2022, 12:01 PM

## 2022-03-25 NOTE — Plan of Care (Signed)

## 2022-03-25 NOTE — Progress Notes (Signed)
ORTHOPAEDICS PROGRESS NOTE  PATIENT NAME: Cassandra Ball DOB: August 04, 1941  MRN: [id number]  POD #1: Left total knee arthroplasty  Subjective: The patient was up in a chair this morning.  Pain has been under good control.  She denies any nausea or vomiting.  She tolerated physical therapy well yesterday.  Objective: Vital signs in last 24 hours: Temp:  [96.4 F (35.8 C)-98.8 F (37.1 C)] 98.8 F (37.1 C) (08/22 0728) Pulse Rate:  [54-74] 58 (08/22 0728) Resp:  [10-18] 16 (08/22 0517) BP: (95-119)/(32-57) 118/47 (08/22 0728) SpO2:  [95 %-100 %] 98 % (08/22 0728)  Intake/Output from previous day: 08/21 0701 - 08/22 0700 In: 2570 [P.O.:420; I.V.:1600; IV Piggyback:550] Out: 495 [Urine:325; Drains:120; Blood:50]  No results for input(s): "WBC", "HGB", "HCT", "PLT", "K", "CL", "CO2", "BUN", "CREATININE", "GLUCOSE", "CALCIUM", "LABPT", "INR" in the last 72 hours.  EXAM General: Well-developed well-nourished female seen in no apparent discomfort. Lungs: clear to auscultation Cardiac: normal rate and regular rhythm Left lower extremity: Dressing is dry and intact.  Polar Care and Hemovac are intact and functioning.  Homans test is negative.  The patient is able to perform an independent straight leg raise. Neurologic: Awake, alert, oriented.Sensory and motor function are grossly intact.   Assessment: Left total knee arthroplasty  Secondary diagnoses: Diverticulosis Glucose intolerance Nephrolithiasis Hyperlipidemia Hypertension  Plan: Notes from physical therapy were reviewed.  Continue with physical therapy and Occupational Therapy as per total knee arthroplasty rehab protocol. Plan is to go Home after hospital stay. DVT Prophylaxis - Lovenox, TED hose, and SCDs  Monia Timmers P. Angie Fava M.D.

## 2022-03-25 NOTE — Progress Notes (Signed)
Post-op dressing removed. , Hemovac removed., and Mini compression dressing applied.  Fresh dressing applied.

## 2022-03-25 NOTE — Progress Notes (Signed)
No therapy recommendation for DME. Patient HH previously arranged with [facility]. TOC signing off.

## 2022-03-25 NOTE — Progress Notes (Signed)
Physical Therapy Treatment Patient Details Name: QUANESHA KLIMASZEWSKI MRN: [id number] DOB: 17-Aug-1940 Today's Date: 03/25/2022   History of Present Illness Pt is an 81 yo female s/p L TKA. PMH of recent R TKA (01/15/2022), HTN.    PT Comments    Patient alert, eager for PT reported 3/10 L knee pain at start of session. She was able to perform transfers with supervision and RW. She ambulated ~287ft with RW and supervision, step through gait pattern, no LOB. Did endorse some fatigue. She also performed stair navigation with CGA, able to verbalize and demonstrate safe technique. Pt/PT reviewed car transfers, all questions/concerns addressed at this time. The patient would benefit from further skilled PT intervention to continue to progress towards goals. Recommendation remains appropriate.     Recommendations for follow up therapy are one component of a multi-disciplinary discharge planning process, led by the attending physician.  Recommendations may be updated based on patient status, additional functional criteria and insurance authorization.  Follow Up Recommendations  Home health PT     Assistance Recommended at Discharge Intermittent Supervision/Assistance  Patient can return home with the following A little help with bathing/dressing/bathroom;Assistance with cooking/housework;Assist for transportation;Help with stairs or ramp for entrance;Direct supervision/assist for medications management   Equipment Recommendations  None recommended by PT    Recommendations for Other Services       Precautions / Restrictions Precautions Precautions: Fall;Knee Restrictions Weight Bearing Restrictions: Yes LLE Weight Bearing: Weight bearing as tolerated     Mobility  Bed Mobility               General bed mobility comments: pt up in chair at start/end of session    Transfers Overall transfer level: Needs assistance Equipment used: Rolling walker (2 wheels) Transfers: Sit to/from  Stand Sit to Stand: Supervision                Ambulation/Gait Ambulation/Gait assistance: Supervision Gait Distance (Feet): 250 Feet Assistive device: Rolling walker (2 wheels)         General Gait Details: step through pattern, overall steady   Stairs Stairs: Yes Stairs assistance: Min guard Stair Management: Two rails, Step to pattern Number of Stairs: 4     Wheelchair Mobility    Modified Rankin (Stroke Patients Only)       Balance Overall balance assessment: Needs assistance Sitting-balance support: Feet supported Sitting balance-Leahy Scale: Good       Standing balance-Leahy Scale: Fair                              Cognition Arousal/Alertness: Awake/alert Behavior During Therapy: WFL for tasks assessed/performed Overall Cognitive Status: Within Functional Limits for tasks assessed                                          Exercises Total Joint Exercises Goniometric ROM: 0-90degrees    General Comments        Pertinent Vitals/Pain Pain Assessment Pain Assessment: 0-10 Pain Score: 3  Pain Location: L knee Pain Descriptors / Indicators: Aching, Grimacing, Sore Pain Intervention(s): Monitored during session, Repositioned, Ice applied    Home Living                          Prior Function  PT Goals (current goals can now be found in the care plan section) Progress towards PT goals: Progressing toward goals    Frequency    BID      PT Plan Current plan remains appropriate    Co-evaluation              AM-PAC PT "6 Clicks" Mobility   Outcome Measure  Help needed turning from your back to your side while in a flat bed without using bedrails?: None Help needed moving from lying on your back to sitting on the side of a flat bed without using bedrails?: None Help needed moving to and from a bed to a chair (including a wheelchair)?: None Help needed standing up from a chair  using your arms (e.g., wheelchair or bedside chair)?: None Help needed to walk in hospital room?: None Help needed climbing 3-5 steps with a railing? : A Little 6 Click Score: 23    End of Session Equipment Utilized During Treatment: Gait belt Activity Tolerance: Patient tolerated treatment well Patient left: in chair;with call bell/phone within reach;with family/visitor present Nurse Communication: Mobility status PT Visit Diagnosis: Other abnormalities of gait and mobility (R26.89);Difficulty in walking, not elsewhere classified (R26.2);Muscle weakness (generalized) (M62.81);Pain Pain - Right/Left: Left Pain - part of body: Knee     Time: 2552-5894 PT Time Calculation (min) (ACUTE ONLY): 12 min  Charges:  $Therapeutic Activity: 8-22 mins                     Olga Coaster PT, DPT 10:54 AM,03/25/22

## 2022-03-25 NOTE — Progress Notes (Signed)
OT Cancellation Note  Patient Details Name: Cassandra Ball MRN: [id number] DOB: [birth year]   Cancelled Treatment:    Reason Eval/Treat Not Completed: OT screened, no needs identified, will sign off. Order received, chart reviewed. Pt reports feeling confident in going home and completing self-care with help from son and daughter who is staying with her upon D/C. Pt does not have any DME needs. No skilled OT needs identified at this time. OT to complete orders.  Dorene Grebe  [facility] 03/25/2022, 11:03 AM

## 2022-06-11 ENCOUNTER — Other Ambulatory Visit: Payer: Self-pay | Admitting: Physician Assistant

## 2022-06-11 DIAGNOSIS — Z1231 Encounter for screening mammogram for malignant neoplasm of breast: Secondary | ICD-10-CM

## 2022-06-19 ENCOUNTER — Ambulatory Visit
Admission: RE | Admit: 2022-06-19 | Discharge: 2022-06-19 | Disposition: A | Discharge status: Home / Self Care | Payer: Medicare HMO | Source: Ambulatory Visit | Attending: Physician Assistant | Admitting: Physician Assistant

## 2022-06-19 DIAGNOSIS — Z1231 Encounter for screening mammogram for malignant neoplasm of breast: Secondary | ICD-10-CM | POA: Diagnosis present

## 2023-08-10 ENCOUNTER — Other Ambulatory Visit: Payer: Self-pay | Admitting: Physician Assistant

## 2023-08-10 DIAGNOSIS — Z1231 Encounter for screening mammogram for malignant neoplasm of breast: Secondary | ICD-10-CM

## 2023-08-13 IMAGING — DX DG KNEE 1-2V PORT*R*
2 series · 2 of 2 positions shown · non-contrast
Comparison: June 17, 2021.

CLINICAL DATA: Status post right total knee replacement.

EXAM:
PORTABLE RIGHT KNEE - 1-2 VIEW

[knee ap]
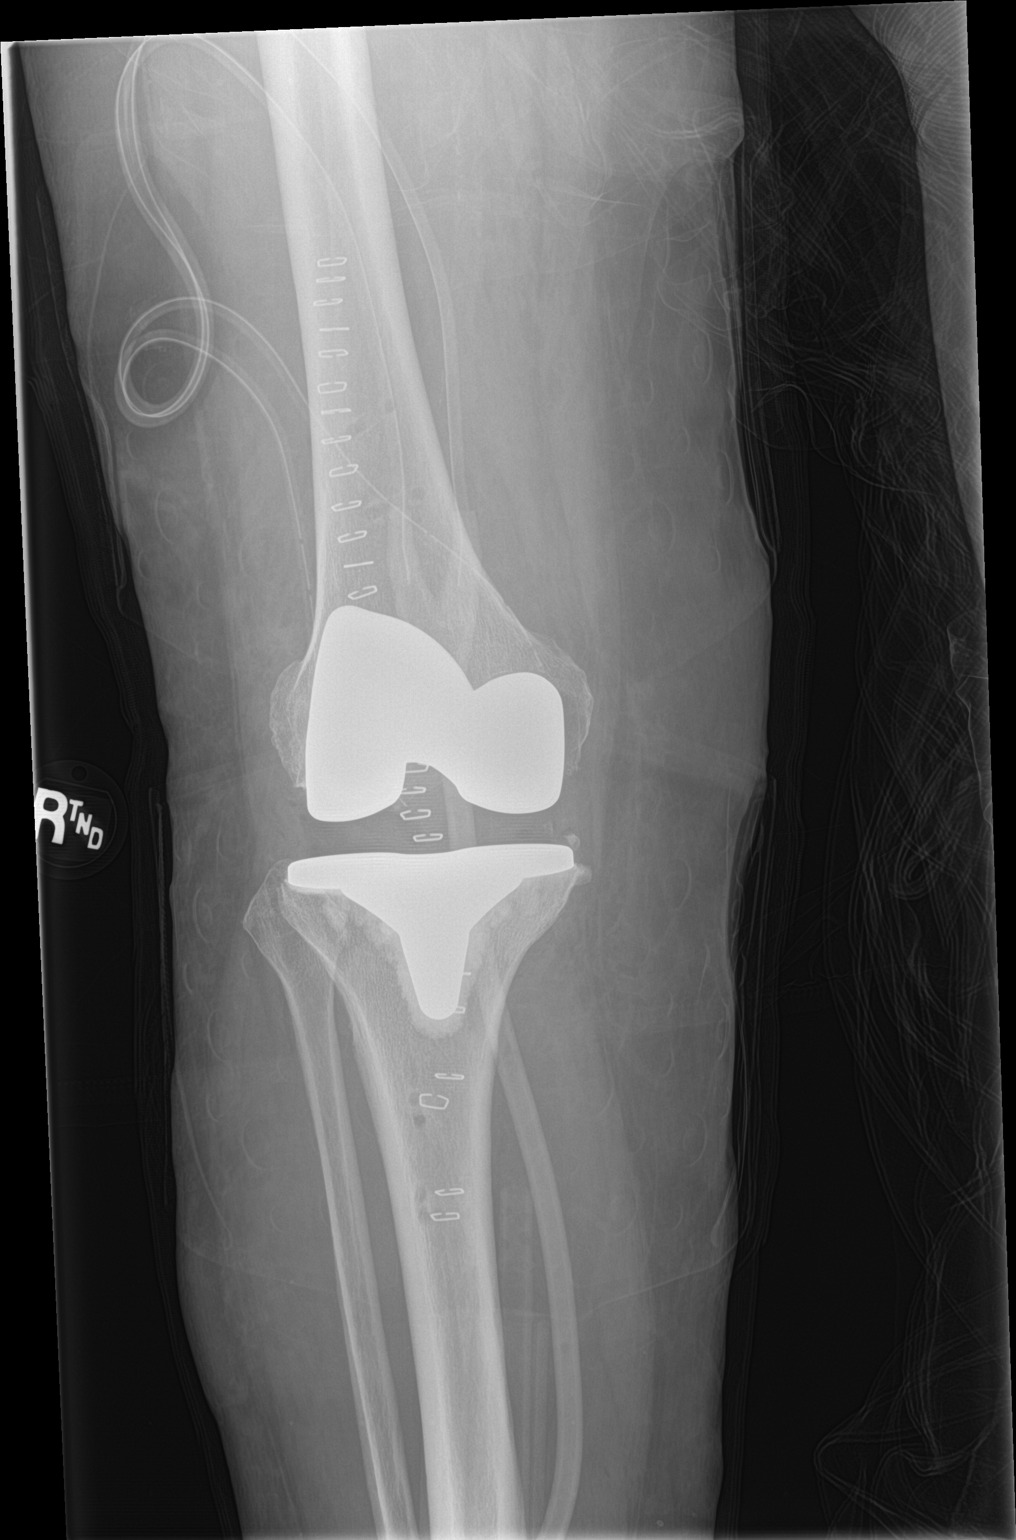

[knee lat]
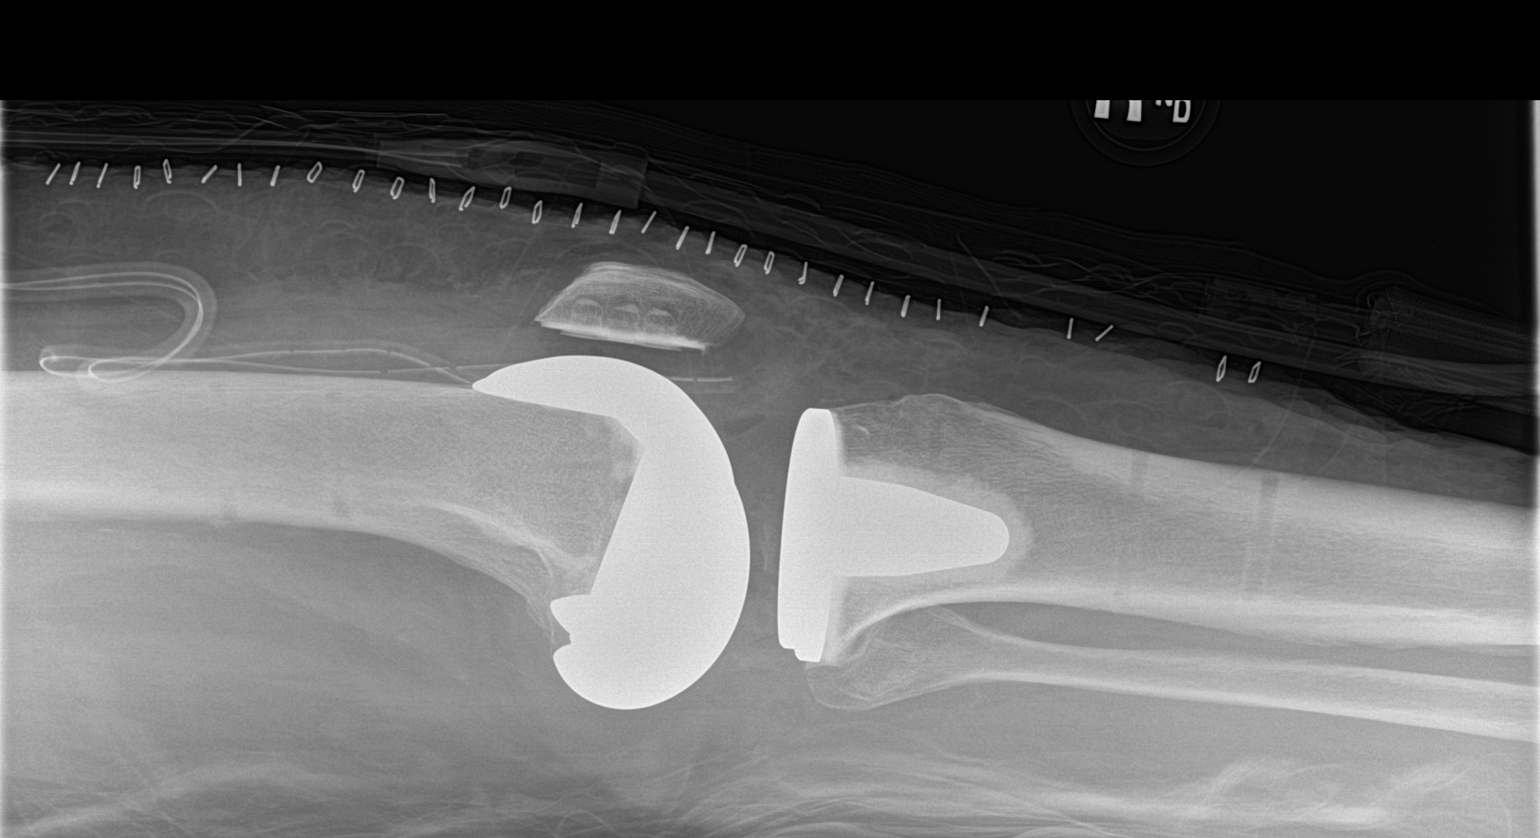

[2 of 2 positions shown; findings below may reference images not displayed]

FINDINGS: Right femoral and tibial components are well situated. Expected
postoperative changes including surgical drain are seen in the soft
tissues anteriorly.
IMPRESSION: Status post right total knee arthroplasty.

## 2023-08-20 ENCOUNTER — Ambulatory Visit
Admission: RE | Admit: 2023-08-20 | Discharge: 2023-08-20 | Disposition: A | Discharge status: Home / Self Care | Payer: Medicare HMO | Source: Ambulatory Visit | Attending: Physician Assistant | Admitting: Physician Assistant

## 2023-08-20 DIAGNOSIS — Z1231 Encounter for screening mammogram for malignant neoplasm of breast: Secondary | ICD-10-CM | POA: Insufficient documentation

## 2024-08-23 ENCOUNTER — Other Ambulatory Visit: Payer: Self-pay | Admitting: Physician Assistant

## 2024-08-23 DIAGNOSIS — Z1231 Encounter for screening mammogram for malignant neoplasm of breast: Secondary | ICD-10-CM

## 2024-09-19 ENCOUNTER — Ambulatory Visit
